# Patient Record
Sex: Female | Born: 1970 | Race: White | Hispanic: No | Marital: Single | Smoking: Current every day smoker
Health system: Southern US, Community
[De-identification: ages and names within clinical notes are randomized; demographics above are authoritative.]

## PROBLEM LIST (undated history)

## (undated) DIAGNOSIS — R059 Cough, unspecified: Secondary | ICD-10-CM

## (undated) DIAGNOSIS — F329 Major depressive disorder, single episode, unspecified: Secondary | ICD-10-CM

## (undated) DIAGNOSIS — J45909 Unspecified asthma, uncomplicated: Secondary | ICD-10-CM

## (undated) DIAGNOSIS — R51 Headache: Secondary | ICD-10-CM

## (undated) DIAGNOSIS — F419 Anxiety disorder, unspecified: Secondary | ICD-10-CM

## (undated) DIAGNOSIS — R05 Cough: Secondary | ICD-10-CM

## (undated) DIAGNOSIS — F32A Depression, unspecified: Secondary | ICD-10-CM

## (undated) DIAGNOSIS — K219 Gastro-esophageal reflux disease without esophagitis: Secondary | ICD-10-CM

## (undated) DIAGNOSIS — R519 Headache, unspecified: Secondary | ICD-10-CM

## (undated) HISTORY — PX: TUBAL LIGATION: SHX77

---

## 2000-10-31 ENCOUNTER — Other Ambulatory Visit: Admission: RE | Admit: 2000-10-31 | Discharge: 2000-10-31 | Payer: Self-pay | Admitting: Obstetrics and Gynecology

## 2001-01-01 ENCOUNTER — Other Ambulatory Visit: Admission: RE | Admit: 2001-01-01 | Discharge: 2001-01-01 | Payer: Self-pay | Admitting: Obstetrics and Gynecology

## 2001-01-03 ENCOUNTER — Inpatient Hospital Stay (HOSPITAL_COMMUNITY): Admission: AD | Admit: 2001-01-03 | Discharge: 2001-01-03 | Payer: Self-pay | Admitting: Obstetrics and Gynecology

## 2001-03-24 ENCOUNTER — Inpatient Hospital Stay (HOSPITAL_COMMUNITY): Admission: AD | Admit: 2001-03-24 | Discharge: 2001-03-24 | Payer: Self-pay | Admitting: Obstetrics and Gynecology

## 2001-03-28 ENCOUNTER — Inpatient Hospital Stay (HOSPITAL_COMMUNITY): Admission: AD | Admit: 2001-03-28 | Discharge: 2001-03-28 | Payer: Self-pay | Admitting: Obstetrics and Gynecology

## 2001-04-04 ENCOUNTER — Inpatient Hospital Stay (HOSPITAL_COMMUNITY): Admission: AD | Admit: 2001-04-04 | Discharge: 2001-04-06 | Payer: Self-pay | Admitting: Obstetrics and Gynecology

## 2003-01-14 ENCOUNTER — Emergency Department (HOSPITAL_COMMUNITY): Admission: EM | Admit: 2003-01-14 | Discharge: 2003-01-14 | Payer: Self-pay | Admitting: Emergency Medicine

## 2003-06-16 ENCOUNTER — Emergency Department (HOSPITAL_COMMUNITY): Admission: EM | Admit: 2003-06-16 | Discharge: 2003-06-16 | Payer: Self-pay | Admitting: Emergency Medicine

## 2004-02-09 ENCOUNTER — Emergency Department (HOSPITAL_COMMUNITY): Admission: EM | Admit: 2004-02-09 | Discharge: 2004-02-09 | Payer: Self-pay | Admitting: Emergency Medicine

## 2004-12-13 ENCOUNTER — Encounter: Payer: Self-pay | Admitting: Emergency Medicine

## 2004-12-13 ENCOUNTER — Inpatient Hospital Stay (HOSPITAL_COMMUNITY): Admission: RE | Admit: 2004-12-13 | Discharge: 2004-12-19 | Payer: Self-pay | Admitting: Psychiatry

## 2004-12-13 ENCOUNTER — Ambulatory Visit: Payer: Self-pay | Admitting: Psychiatry

## 2012-08-13 ENCOUNTER — Emergency Department (HOSPITAL_COMMUNITY)
Admission: EM | Admit: 2012-08-13 | Discharge: 2012-08-14 | Disposition: A | Discharge status: Home / Self Care | Payer: Self-pay | Attending: Emergency Medicine | Admitting: Emergency Medicine

## 2012-08-13 ENCOUNTER — Encounter (HOSPITAL_COMMUNITY): Payer: Self-pay | Admitting: *Deleted

## 2012-08-13 DIAGNOSIS — Z79899 Other long term (current) drug therapy: Secondary | ICD-10-CM | POA: Insufficient documentation

## 2012-08-13 DIAGNOSIS — K219 Gastro-esophageal reflux disease without esophagitis: Secondary | ICD-10-CM | POA: Insufficient documentation

## 2012-08-13 DIAGNOSIS — R1013 Epigastric pain: Secondary | ICD-10-CM | POA: Insufficient documentation

## 2012-08-13 DIAGNOSIS — F17200 Nicotine dependence, unspecified, uncomplicated: Secondary | ICD-10-CM | POA: Insufficient documentation

## 2012-08-13 DIAGNOSIS — Z3202 Encounter for pregnancy test, result negative: Secondary | ICD-10-CM | POA: Insufficient documentation

## 2012-08-13 DIAGNOSIS — R112 Nausea with vomiting, unspecified: Secondary | ICD-10-CM | POA: Insufficient documentation

## 2012-08-13 HISTORY — DX: Gastro-esophageal reflux disease without esophagitis: K21.9

## 2012-08-13 LAB — CBC WITH DIFFERENTIAL/PLATELET
Basophils Relative: 0 % (ref 0–1)
Eosinophils Relative: 3 % (ref 0–5)
HCT: 40.7 % (ref 36.0–46.0)
Hemoglobin: 13.7 g/dL (ref 12.0–15.0)
Lymphocytes Relative: 11 % — ABNORMAL LOW (ref 12–46)
MCHC: 33.7 g/dL (ref 30.0–36.0)
MCV: 89.8 fL (ref 78.0–100.0)
Monocytes Absolute: 0.5 10*3/uL (ref 0.1–1.0)
Monocytes Relative: 4 % (ref 3–12)
Neutro Abs: 8.6 10*3/uL — ABNORMAL HIGH (ref 1.7–7.7)
RDW: 12.9 % (ref 11.5–15.5)

## 2012-08-13 LAB — URINALYSIS, ROUTINE W REFLEX MICROSCOPIC
Nitrite: NEGATIVE
Protein, ur: NEGATIVE mg/dL
Specific Gravity, Urine: 1.028 (ref 1.005–1.030)
Urobilinogen, UA: 0.2 mg/dL (ref 0.0–1.0)

## 2012-08-13 LAB — COMPREHENSIVE METABOLIC PANEL
BUN: 12 mg/dL (ref 6–23)
CO2: 24 mEq/L (ref 19–32)
Calcium: 9 mg/dL (ref 8.4–10.5)
Chloride: 104 mEq/L (ref 96–112)
Creatinine, Ser: 0.61 mg/dL (ref 0.50–1.10)
GFR calc Af Amer: >90 mL/min (ref >90)
GFR calc non Af Amer: >90 mL/min (ref >90)
Total Bilirubin: 0.4 mg/dL (ref 0.3–1.2)

## 2012-08-13 LAB — PREGNANCY, URINE: Preg Test, Ur: NEGATIVE

## 2012-08-13 LAB — URINE MICROSCOPIC-ADD ON

## 2012-08-13 MED ORDER — ONDANSETRON HCL 4 MG PO TABS: 4.0000 mg | ORAL_TABLET | Freq: Four times a day (QID) | ORAL | Status: DC

## 2012-08-13 MED ORDER — ONDANSETRON HCL 4 MG/2ML IJ SOLN
4.0000 mg | Freq: Once | INTRAMUSCULAR | Status: AC
  Administered 2012-08-13: 4 mg via INTRAVENOUS
  Filled 2012-08-13: qty 2

## 2012-08-13 MED ORDER — FAMOTIDINE 20 MG PO TABS: 20.0000 mg | ORAL_TABLET | Freq: Two times a day (BID) | ORAL | Status: DC

## 2012-08-13 MED ORDER — SODIUM CHLORIDE 0.9 % IV BOLUS (SEPSIS)
1000.0000 mL | Freq: Once | INTRAVENOUS | Status: AC
  Administered 2012-08-13: 1000 mL via INTRAVENOUS

## 2012-08-13 MED ORDER — FAMOTIDINE IN NACL 20-0.9 MG/50ML-% IV SOLN
20.0000 mg | Freq: Once | INTRAVENOUS | Status: AC
  Administered 2012-08-13: 20 mg via INTRAVENOUS
  Filled 2012-08-13: qty 50

## 2012-08-13 NOTE — ED Notes (Signed)
Pt started vomiting around 3pm; c/o bright red blood in emesis; small amt blood; denies black or bloody stools; abd pain

## 2012-08-13 NOTE — ED Provider Notes (Signed)
History     CSN: [id number]  Arrival date & time 08/13/12  4782   First MD Initiated Contact with Patient 08/13/12 2006      Chief Complaint  Patient presents with  . Hematemesis    (Consider location/radiation/quality/duration/timing/severity/associated sxs/prior treatment) HPI Comments: Patient has had multiple episodes of vomiting since 3PM today she has noticed streaks of blood in emesis.  Has a Hx of GERD and duodenal ulcer Dx 1 years ago but has not taken any medications as her insurance lapsed. Her daughter has been ill with  Same for the past  3 days   The history is provided by the patient.    Past Medical History  Diagnosis Date  . GERD (gastroesophageal reflux disease)     History reviewed. No pertinent past surgical history.  No family history on file.  History  Substance Use Topics  . Smoking status: Current Every Day Smoker -- 0.25 packs/day    Types: Cigarettes  . Smokeless tobacco: Not on file  . Alcohol Use: Yes     Comment: social    OB History   Grav Para Term Preterm Abortions TAB SAB Ect Mult Living                  Review of Systems  Constitutional: Negative for fever and chills.  Gastrointestinal: Positive for nausea, vomiting and abdominal pain. Negative for diarrhea and constipation.  Musculoskeletal: Negative for myalgias and back pain.  Skin: Negative for pallor.  All other systems reviewed and are negative.    Allergies  Review of patient's allergies indicates no known allergies.  Home Medications   Current Outpatient Rx  Name  Route  Sig  Dispense  Refill  . ibuprofen (ADVIL,MOTRIN) 200 MG tablet   Oral   Take 400 mg by mouth every 8 (eight) hours as needed for pain.         . Multiple Vitamin (MULTIVITAMIN WITH MINERALS) TABS   Oral   Take 1 tablet by mouth daily.         . famotidine (PEPCID) 20 MG tablet   Oral   Take 1 tablet (20 mg total) by mouth 2 (two) times daily.   30 tablet   0     Take twice a day  for 2 weeks than daily   . ondansetron (ZOFRAN) 4 MG tablet   Oral   Take 1 tablet (4 mg total) by mouth every 6 (six) hours.   12 tablet   0     BP 93/48  Pulse 74  Temp(Src) 98.9 F (37.2 C) (Oral)  Resp 18  SpO2 100%  LMP 08/06/2012  Physical Exam  Constitutional: She is oriented to person, place, and time. She appears well-developed and well-nourished.  HENT:  Head: Normocephalic.  Eyes: Pupils are equal, round, and reactive to light.  Cardiovascular: Normal rate and regular rhythm.   Pulmonary/Chest: Effort normal and breath sounds normal.  Abdominal: Soft. Bowel sounds are normal. She exhibits no distension. There is no hepatosplenomegaly. There is tenderness in the epigastric area. There is no rebound, no guarding and negative Murphy's sign.  Musculoskeletal: Normal range of motion.  Neurological: She is alert and oriented to person, place, and time.  Skin: Skin is warm.    ED Course  Procedures (including critical care time)  Labs Reviewed  CBC WITH DIFFERENTIAL - Abnormal; Notable for the following:    WBC 10.6 (*)    Neutrophils Relative 81 (*)    Neutro Abs  8.6 (*)    Lymphocytes Relative 11 (*)    All other components within normal limits  COMPREHENSIVE METABOLIC PANEL - Abnormal; Notable for the following:    Glucose, Bld 101 (*)    All other components within normal limits  URINALYSIS, ROUTINE W REFLEX MICROSCOPIC - Abnormal; Notable for the following:    APPearance CLOUDY (*)    Hgb urine dipstick LARGE (*)    Leukocytes, UA TRACE (*)    All other components within normal limits  URINE MICROSCOPIC-ADD ON - Abnormal; Notable for the following:    Squamous Epithelial / LPF MANY (*)    All other components within normal limits  PREGNANCY, URINE   No results found.   1. Nausea & vomiting   2. Epigastric pain       MDM  Will hydrate, give antiemetic  labs normal  Tolerating PO will DC home with Zofran, and RX for Pepcid with Gi referral         Arman Filter, NP 08/13/12 2348

## 2012-08-13 NOTE — ED Provider Notes (Signed)
Medical screening examination/treatment/procedure(s) were performed by non-physician practitioner and as supervising physician I was immediately available for consultation/collaboration.    Celene Kras, MD 08/13/12 [phone]

## 2012-08-14 MED ORDER — FAMOTIDINE 20 MG PO TABS: 20.0000 mg | ORAL_TABLET | Freq: Two times a day (BID) | ORAL | Status: DC

## 2012-08-14 MED ORDER — ONDANSETRON HCL 4 MG PO TABS: 4.0000 mg | ORAL_TABLET | Freq: Four times a day (QID) | ORAL | Status: DC

## 2012-10-06 ENCOUNTER — Encounter (HOSPITAL_BASED_OUTPATIENT_CLINIC_OR_DEPARTMENT_OTHER): Payer: Self-pay | Admitting: Family Medicine

## 2012-10-06 ENCOUNTER — Emergency Department (HOSPITAL_BASED_OUTPATIENT_CLINIC_OR_DEPARTMENT_OTHER)
Admission: EM | Admit: 2012-10-06 | Discharge: 2012-10-06 | Disposition: A | Discharge status: Home / Self Care | Payer: Self-pay | Attending: Emergency Medicine | Admitting: Emergency Medicine

## 2012-10-06 DIAGNOSIS — X503XXA Overexertion from repetitive movements, initial encounter: Secondary | ICD-10-CM | POA: Insufficient documentation

## 2012-10-06 DIAGNOSIS — S39012A Strain of muscle, fascia and tendon of lower back, initial encounter: Secondary | ICD-10-CM

## 2012-10-06 DIAGNOSIS — K219 Gastro-esophageal reflux disease without esophagitis: Secondary | ICD-10-CM | POA: Insufficient documentation

## 2012-10-06 DIAGNOSIS — Y9389 Activity, other specified: Secondary | ICD-10-CM | POA: Insufficient documentation

## 2012-10-06 DIAGNOSIS — S335XXA Sprain of ligaments of lumbar spine, initial encounter: Secondary | ICD-10-CM | POA: Insufficient documentation

## 2012-10-06 DIAGNOSIS — Y9289 Other specified places as the place of occurrence of the external cause: Secondary | ICD-10-CM | POA: Insufficient documentation

## 2012-10-06 DIAGNOSIS — Y990 Civilian activity done for income or pay: Secondary | ICD-10-CM | POA: Insufficient documentation

## 2012-10-06 DIAGNOSIS — F17200 Nicotine dependence, unspecified, uncomplicated: Secondary | ICD-10-CM | POA: Insufficient documentation

## 2012-10-06 DIAGNOSIS — Z79899 Other long term (current) drug therapy: Secondary | ICD-10-CM | POA: Insufficient documentation

## 2012-10-06 MED ORDER — NAPROXEN 500 MG PO TABS: 500.0000 mg | ORAL_TABLET | Freq: Two times a day (BID) | ORAL | Status: DC

## 2012-10-06 MED ORDER — ONDANSETRON 4 MG PO TBDP
4.0000 mg | ORAL_TABLET | Freq: Once | ORAL | Status: AC
Start: 2012-10-06 — End: 2012-10-06
  Administered 2012-10-06: 4 mg via ORAL
  Filled 2012-10-06: qty 1

## 2012-10-06 MED ORDER — HYDROCODONE-ACETAMINOPHEN 5-325 MG PO TABS: 1.0000 | ORAL_TABLET | Freq: Four times a day (QID) | ORAL | Status: DC | PRN

## 2012-10-06 MED ORDER — HYDROMORPHONE HCL PF 2 MG/ML IJ SOLN
2.0000 mg | Freq: Once | INTRAMUSCULAR | Status: AC
  Administered 2012-10-06: 2 mg via INTRAMUSCULAR
  Filled 2012-10-06: qty 1

## 2012-10-06 MED ORDER — CYCLOBENZAPRINE HCL 10 MG PO TABS: 10.0000 mg | ORAL_TABLET | Freq: Two times a day (BID) | ORAL | Status: DC | PRN

## 2012-10-06 NOTE — ED Provider Notes (Signed)
History     CSN: [id number]  Arrival date & time 10/06/12  1111   First MD Initiated Contact with Patient 10/06/12 1129      Chief Complaint  Patient presents with  . Back Pain    (Consider location/radiation/quality/duration/timing/severity/associated sxs/prior treatment) Patient is a 42 y.o. female presenting with back pain. The history is provided by the patient and a friend.  Back Pain Associated symptoms: no abdominal pain, no chest pain, no dysuria, no fever and no headaches    patient with onset of right lower back pain radiating to the buttocks it started 2 days ago. 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 Patient without history of any back problems in the past. No direct injury or blow to the back. Patient denies any numbness or weakness to her right leg. Pain is 10 out of 10 radiates to the buttocks area described as sharp. Made worse by any kind of movement.  Past Medical History  Diagnosis Date  . GERD (gastroesophageal reflux disease)     Past Surgical History  Procedure Laterality Date  . Tubal ligation      No family history on file.  History  Substance Use Topics  . Smoking status: Current Every Day Smoker -- 0.25 packs/day    Types: Cigarettes  . Smokeless tobacco: Not on file  . Alcohol Use: Yes     Comment: social    OB History   Grav Para Term Preterm Abortions TAB SAB Ect Mult Living                  Review of Systems  Constitutional: Negative for fever.  HENT: Negative for neck pain.   Respiratory: Negative for shortness of breath.   Cardiovascular: Negative for chest pain and leg swelling.  Gastrointestinal: Negative for nausea, vomiting and abdominal pain.  Genitourinary: Negative for dysuria.  Musculoskeletal: Positive for back pain.  Skin: Negative for rash.  Neurological: Negative for headaches.  Hematological: Does not bruise/bleed easily.  Psychiatric/Behavioral: Negative for confusion.    Allergies   Review of patient's allergies indicates no known allergies.  Home Medications   Current Outpatient Rx  Name  Route  Sig  Dispense  Refill  . cyclobenzaprine (FLEXERIL) 10 MG tablet   Oral   Take 1 tablet (10 mg total) by mouth 2 (two) times daily as needed for muscle spasms.   20 tablet   0   . famotidine (PEPCID) 20 MG tablet   Oral   Take 1 tablet (20 mg total) by mouth 2 (two) times daily.   30 tablet   0     Take twice a day for 2 weeks than daily   . HYDROcodone-acetaminophen (NORCO/VICODIN) 5-325 MG per tablet   Oral   Take 1-2 tablets by mouth every 6 (six) hours as needed for pain.   20 tablet   0   . ibuprofen (ADVIL,MOTRIN) 200 MG tablet   Oral   Take 400 mg by mouth every 8 (eight) hours as needed for pain.         . Multiple Vitamin (MULTIVITAMIN WITH MINERALS) TABS   Oral   Take 1 tablet by mouth daily.         . naproxen (NAPROSYN) 500 MG tablet   Oral   Take 1 tablet (500 mg total) by mouth 2 (two) times daily.   14 tablet   0   . ondansetron (ZOFRAN) 4 MG tablet   Oral  Take 1 tablet (4 mg total) by mouth every 6 (six) hours.   12 tablet   0     BP 109/56  Pulse 70  Resp 16  Ht 5\' 3"  (1.6 m)  Wt 117 lb (53.071 kg)  BMI 20.73 kg/m2  SpO2 98%  LMP 09/15/2012  Physical Exam  Nursing note and vitals reviewed. Constitutional: She is oriented to person, place, and time. She appears well-developed and well-nourished.  HENT:  Head: Normocephalic and atraumatic.  Eyes: Conjunctivae are normal. Pupils are equal, round, and reactive to light.  Neck: Normal range of motion. Neck supple.  Cardiovascular: Normal rate, regular rhythm and normal heart sounds.   No murmur heard. Pulmonary/Chest: Effort normal and breath sounds normal.  Abdominal: Soft. Bowel sounds are normal. There is no tenderness.  Musculoskeletal: Normal range of motion. She exhibits no edema and no tenderness.  Neurological: She is alert and oriented to person, place,  and time. No cranial nerve deficit. She exhibits normal muscle tone. Coordination normal.  Skin: Skin is warm. No rash noted.    ED Course  Procedures (including critical care time)  Labs Reviewed - No data to display No results found.   1. Lumbar strain, initial encounter       MDM  The patient with acute lumbar strain no direct injury. Started on Saturday 2 days ago. Patient works and does a lot of heavy lifting at work. Things got worse yesterday much worse today. To the point where is very difficult for her to get out of bed. Left low back radiates into the left buttocks area of no neuro deficits to the left leg. Patient was taking ibuprofen at home. No history of back problems in the past. Exam here not consistent with any significant neuro deficits the lower trimming these her sciatica. No direct injury x-rays not required at this point in time.       Shelda Jakes, MD 10/06/12 [phone]

## 2012-10-06 NOTE — ED Notes (Signed)
Pt c/o low back pain x 2 days without known injury. Pt denies dysuria. Pt sts ibuprofen improves pain.

## 2013-01-09 ENCOUNTER — Encounter (HOSPITAL_BASED_OUTPATIENT_CLINIC_OR_DEPARTMENT_OTHER): Payer: Self-pay | Admitting: *Deleted

## 2013-01-09 ENCOUNTER — Emergency Department (HOSPITAL_BASED_OUTPATIENT_CLINIC_OR_DEPARTMENT_OTHER)
Admission: EM | Admit: 2013-01-09 | Discharge: 2013-01-09 | Disposition: A | Discharge status: Home / Self Care | Payer: Self-pay | Attending: Emergency Medicine | Admitting: Emergency Medicine

## 2013-01-09 DIAGNOSIS — Z791 Long term (current) use of non-steroidal anti-inflammatories (NSAID): Secondary | ICD-10-CM | POA: Insufficient documentation

## 2013-01-09 DIAGNOSIS — Z79899 Other long term (current) drug therapy: Secondary | ICD-10-CM | POA: Insufficient documentation

## 2013-01-09 DIAGNOSIS — F17200 Nicotine dependence, unspecified, uncomplicated: Secondary | ICD-10-CM | POA: Insufficient documentation

## 2013-01-09 DIAGNOSIS — R519 Headache, unspecified: Secondary | ICD-10-CM

## 2013-01-09 DIAGNOSIS — R51 Headache: Secondary | ICD-10-CM | POA: Insufficient documentation

## 2013-01-09 DIAGNOSIS — K219 Gastro-esophageal reflux disease without esophagitis: Secondary | ICD-10-CM | POA: Insufficient documentation

## 2013-01-09 MED ORDER — DIPHENHYDRAMINE HCL 50 MG/ML IJ SOLN
25.0000 mg | Freq: Once | INTRAMUSCULAR | Status: AC
  Administered 2013-01-09: 25 mg via INTRAVENOUS
  Filled 2013-01-09: qty 1

## 2013-01-09 MED ORDER — SODIUM CHLORIDE 0.9 % IV SOLN
Freq: Once | INTRAVENOUS | Status: AC
  Administered 2013-01-09: 1000 mL via INTRAVENOUS

## 2013-01-09 MED ORDER — METOCLOPRAMIDE HCL 5 MG/ML IJ SOLN
10.0000 mg | Freq: Once | INTRAMUSCULAR | Status: AC
  Administered 2013-01-09: 10 mg via INTRAVENOUS
  Filled 2013-01-09: qty 2

## 2013-01-09 MED ORDER — IBUPROFEN 800 MG PO TABS: 800.0000 mg | ORAL_TABLET | Freq: Three times a day (TID) | ORAL | Status: DC

## 2013-01-09 MED ORDER — KETOROLAC TROMETHAMINE 30 MG/ML IJ SOLN
30.0000 mg | Freq: Once | INTRAMUSCULAR | Status: AC
  Administered 2013-01-09: 30 mg via INTRAVENOUS
  Filled 2013-01-09: qty 1

## 2013-01-09 MED ORDER — HYDROCODONE-ACETAMINOPHEN 5-325 MG PO TABS: 2.0000 | ORAL_TABLET | ORAL | Status: DC | PRN

## 2013-01-09 NOTE — ED Notes (Signed)
Additional warm blankets given  

## 2013-01-09 NOTE — ED Provider Notes (Signed)
CSN: [id number]     Arrival date & time 01/09/13  2034 History     First MD Initiated Contact with Patient 01/09/13 2131     Chief Complaint  Patient presents with  . Migraine   (Consider location/radiation/quality/duration/timing/severity/associated sxs/prior Treatment) Patient is a 42 y.o. female presenting with migraines. The history is provided by the patient. No language interpreter was used.  Migraine This is a new problem. The current episode started today. The problem has been gradually worsening. Associated symptoms include headaches. Nothing aggravates the symptoms. She has tried nothing for the symptoms. The treatment provided moderate relief.  Pt complains of a headache for 3 days.  Pt reports pain to the back of her head.    Past Medical History  Diagnosis Date  . GERD (gastroesophageal reflux disease)    Past Surgical History  Procedure Laterality Date  . Tubal ligation     No family history on file. History  Substance Use Topics  . Smoking status: Current Every Day Smoker -- 0.25 packs/day    Types: Cigarettes  . Smokeless tobacco: Not on file  . Alcohol Use: Yes     Comment: social   OB History   Grav Para Term Preterm Abortions TAB SAB Ect Mult Living                 Review of Systems  Neurological: Positive for headaches.  All other systems reviewed and are negative.    Allergies  Review of patient's allergies indicates no known allergies.  Home Medications   Current Outpatient Rx  Name  Route  Sig  Dispense  Refill  . cyclobenzaprine (FLEXERIL) 10 MG tablet   Oral   Take 1 tablet (10 mg total) by mouth 2 (two) times daily as needed for muscle spasms.   20 tablet   0   . famotidine (PEPCID) 20 MG tablet   Oral   Take 1 tablet (20 mg total) by mouth 2 (two) times daily.   30 tablet   0     Take twice a day for 2 weeks than daily   . HYDROcodone-acetaminophen (NORCO/VICODIN) 5-325 MG per tablet   Oral   Take 1-2 tablets by mouth every  6 (six) hours as needed for pain.   20 tablet   0   . ibuprofen (ADVIL,MOTRIN) 200 MG tablet   Oral   Take 400 mg by mouth every 8 (eight) hours as needed for pain.         . Multiple Vitamin (MULTIVITAMIN WITH MINERALS) TABS   Oral   Take 1 tablet by mouth daily.         . naproxen (NAPROSYN) 500 MG tablet   Oral   Take 1 tablet (500 mg total) by mouth 2 (two) times daily.   14 tablet   0   . ondansetron (ZOFRAN) 4 MG tablet   Oral   Take 1 tablet (4 mg total) by mouth every 6 (six) hours.   12 tablet   0    BP 108/72  Pulse 73  Temp(Src) 98 F (36.7 C) (Oral)  Resp 16  Ht 5\' 3"  (1.6 m)  Wt 117 lb (53.071 kg)  BMI 20.73 kg/m2  SpO2 99% Physical Exam  Nursing note and vitals reviewed. Constitutional: She appears well-developed and well-nourished.  HENT:  Head: Normocephalic.  Eyes: Conjunctivae and EOM are normal. Pupils are equal, round, and reactive to light.  Neck: Normal range of motion. Neck supple.  Pulmonary/Chest: Effort normal.  Abdominal: Soft.  Musculoskeletal: Normal range of motion.  Neurological: She is alert.  Skin: Skin is warm.  Psychiatric: She has a normal mood and affect.    ED Course   Procedures (including critical care time)  Labs Reviewed - No data to display No results found. 1. Headache     MDM  Pt given Iv fluids, and [place] cocktail.    Pt advised rest.   Pt given rx for ibuprofen and hydrocodone  Elson Areas, PA-C 01/09/13 2255

## 2013-01-09 NOTE — ED Notes (Signed)
Pt reports pain at this time minimal compared to previous.  Friends stepping outside briefly while waiting d/c papers.  Warm blankets offered to pt.

## 2013-01-09 NOTE — ED Notes (Signed)
PA at bedside.

## 2013-01-09 NOTE — ED Provider Notes (Signed)
Medical screening examination/treatment/procedure(s) were performed by non-physician practitioner and as supervising physician I was immediately available for consultation/collaboration.   Gavin Pound. Oletta Lamas, MD 01/09/13 2258

## 2013-01-09 NOTE — ED Notes (Signed)
Headache x 3 days. Nausea.  

## 2013-03-05 ENCOUNTER — Ambulatory Visit (HOSPITAL_COMMUNITY)
Admission: RE | Admit: 2013-03-05 | Discharge: 2013-03-05 | Disposition: A | Discharge status: Home / Self Care | Payer: Self-pay | Source: Ambulatory Visit | Attending: Chiropractic Medicine | Admitting: Chiropractic Medicine

## 2013-03-05 ENCOUNTER — Ambulatory Visit (HOSPITAL_COMMUNITY)
Admission: RE | Admit: 2013-03-05 | Discharge: 2013-03-05 | Disposition: A | Discharge status: Home / Self Care | Payer: BC Managed Care – PPO | Source: Ambulatory Visit | Attending: Chiropractic Medicine | Admitting: Chiropractic Medicine

## 2013-03-05 ENCOUNTER — Other Ambulatory Visit (HOSPITAL_COMMUNITY): Payer: Self-pay | Admitting: Chiropractic Medicine

## 2013-03-05 DIAGNOSIS — M549 Dorsalgia, unspecified: Secondary | ICD-10-CM | POA: Insufficient documentation

## 2013-03-05 DIAGNOSIS — M25511 Pain in right shoulder: Secondary | ICD-10-CM

## 2013-03-05 DIAGNOSIS — M25519 Pain in unspecified shoulder: Secondary | ICD-10-CM | POA: Insufficient documentation

## 2013-03-05 DIAGNOSIS — M542 Cervicalgia: Secondary | ICD-10-CM

## 2013-03-05 DIAGNOSIS — M5030 Other cervical disc degeneration, unspecified cervical region: Secondary | ICD-10-CM | POA: Insufficient documentation

## 2013-10-13 ENCOUNTER — Other Ambulatory Visit: Payer: Self-pay | Admitting: Neurosurgery

## 2013-10-13 DIAGNOSIS — M5412 Radiculopathy, cervical region: Secondary | ICD-10-CM

## 2013-10-26 ENCOUNTER — Ambulatory Visit
Admission: RE | Admit: 2013-10-26 | Discharge: 2013-10-26 | Disposition: A | Discharge status: Home / Self Care | Payer: BC Managed Care – PPO | Source: Ambulatory Visit | Attending: Neurosurgery | Admitting: Neurosurgery

## 2013-10-26 VITALS — BP 84/48 | HR 52

## 2013-10-26 DIAGNOSIS — M5412 Radiculopathy, cervical region: Secondary | ICD-10-CM

## 2013-10-26 MED ORDER — DIAZEPAM 5 MG PO TABS
5.0000 mg | ORAL_TABLET | Freq: Once | ORAL | Status: AC
  Administered 2013-10-26: 5 mg via ORAL

## 2013-10-26 MED ORDER — IOHEXOL 300 MG/ML  SOLN
10.0000 mL | Freq: Once | INTRAMUSCULAR | Status: AC | PRN
  Administered 2013-10-26: 10 mL via INTRATHECAL

## 2013-10-26 MED ORDER — MEPERIDINE HCL 100 MG/ML IJ SOLN
75.0000 mg | Freq: Once | INTRAMUSCULAR | Status: AC
  Administered 2013-10-26: 75 mg via INTRAMUSCULAR

## 2013-10-26 MED ORDER — ONDANSETRON HCL 8 MG PO TABS
8.0000 mg | ORAL_TABLET | Freq: Once | ORAL | Status: AC
  Administered 2013-10-26: 8 mg via ORAL

## 2013-10-26 NOTE — Progress Notes (Signed)
Patient states she has been off Wellbutrin for the past two days.

## 2013-10-26 NOTE — Discharge Instructions (Signed)
Myelogram Discharge Instructions  1. Go home and rest quietly for the next 24 hours.  It is important to lie flat for the next 24 hours.  Get up only to go to the restroom.  You may lie in the bed or on a couch on your back, your stomach, your left side or your right side.  You may have one pillow under your head.  You may have pillows between your knees while you are on your side or under your knees while you are on your back.  2. DO NOT drive today.  Recline the seat as far back as it will go, while still wearing your seat belt, on the way home.  3. You may get up to go to the bathroom as needed.  You may sit up for 10 minutes to eat.  You may resume your normal diet and medications unless otherwise indicated.  Drink lots of extra fluids today and tomorrow.  4. The incidence of headache, nausea, or vomiting is about 5% (one in 20 patients).  If you develop a headache, lie flat and drink plenty of fluids until the headache goes away.  Caffeinated beverages may be helpful.  If you develop severe nausea and vomiting or a headache that does not go away with flat bed rest, call [phone].  5. You may resume normal activities after your 24 hours of bed rest is over; however, do not exert yourself strongly or do any heavy lifting tomorrow. If when you get up you have a headache when standing, go back to bed and force fluids for another 24 hours.  6. Call your physician for a follow-up appointment.  The results of your myelogram will be sent directly to your physician by the following day.  7. If you have any questions or if complications develop after you arrive home, please call [phone].  Discharge instructions have been explained to the patient.  The patient, or the person responsible for the patient, fully understands these instructions.      May resume Wellbutrin on Oct 27, 2013, after 1:00 pm.

## 2014-03-08 ENCOUNTER — Other Ambulatory Visit: Payer: Self-pay

## 2014-03-08 DIAGNOSIS — Z1231 Encounter for screening mammogram for malignant neoplasm of breast: Secondary | ICD-10-CM

## 2014-03-15 ENCOUNTER — Ambulatory Visit
Admission: RE | Admit: 2014-03-15 | Discharge: 2014-03-15 | Disposition: A | Discharge status: Home / Self Care | Payer: BC Managed Care – PPO | Source: Ambulatory Visit

## 2014-03-15 DIAGNOSIS — Z1231 Encounter for screening mammogram for malignant neoplasm of breast: Secondary | ICD-10-CM

## 2014-04-14 ENCOUNTER — Other Ambulatory Visit: Payer: Self-pay | Admitting: Neurosurgery

## 2014-04-21 ENCOUNTER — Encounter (HOSPITAL_COMMUNITY)
Admission: RE | Admit: 2014-04-21 | Discharge: 2014-04-21 | Disposition: A | Discharge status: Home / Self Care | Payer: BC Managed Care – PPO | Source: Ambulatory Visit | Attending: Neurosurgery | Admitting: Neurosurgery

## 2014-04-21 ENCOUNTER — Encounter (HOSPITAL_COMMUNITY): Payer: Self-pay

## 2014-04-21 DIAGNOSIS — R0602 Shortness of breath: Secondary | ICD-10-CM

## 2014-04-21 HISTORY — DX: Cough: R05

## 2014-04-21 HISTORY — DX: Cough, unspecified: R05.9

## 2014-04-21 HISTORY — DX: Anxiety disorder, unspecified: F41.9

## 2014-04-21 HISTORY — DX: Major depressive disorder, single episode, unspecified: F32.9

## 2014-04-21 HISTORY — DX: Headache: R51

## 2014-04-21 HISTORY — DX: Headache, unspecified: R51.9

## 2014-04-21 HISTORY — DX: Unspecified asthma, uncomplicated: J45.909

## 2014-04-21 HISTORY — DX: Depression, unspecified: F32.A

## 2014-04-21 LAB — BASIC METABOLIC PANEL
Anion gap: 12 (ref 5–15)
BUN: 13 mg/dL (ref 6–23)
CALCIUM: 9.4 mg/dL (ref 8.4–10.5)
CO2: 24 mEq/L (ref 19–32)
Chloride: 104 mEq/L (ref 96–112)
Creatinine, Ser: 0.69 mg/dL (ref 0.50–1.10)
Glucose, Bld: 80 mg/dL (ref 70–99)
POTASSIUM: 3.9 meq/L (ref 3.7–5.3)
Sodium: 140 mEq/L (ref 137–147)

## 2014-04-21 LAB — CBC
HCT: 39.4 % (ref 36.0–46.0)
Hemoglobin: 13.1 g/dL (ref 12.0–15.0)
MCH: 30.6 pg (ref 26.0–34.0)
MCHC: 33.2 g/dL (ref 30.0–36.0)
MCV: 92.1 fL (ref 78.0–100.0)
PLATELETS: 221 10*3/uL (ref 150–400)
RBC: 4.28 MIL/uL (ref 3.87–5.11)
RDW: 13.3 % (ref 11.5–15.5)
WBC: 6.4 10*3/uL (ref 4.0–10.5)

## 2014-04-21 LAB — SURGICAL PCR SCREEN
MRSA, PCR: NEGATIVE
STAPHYLOCOCCUS AUREUS: NEGATIVE

## 2014-04-21 LAB — HCG, SERUM, QUALITATIVE: PREG SERUM: NEGATIVE

## 2014-04-21 NOTE — Pre-Procedure Instructions (Signed)
Kirsten Benson  04/21/2014   Your procedure is scheduled on:  04/23/14  Report to [facility] Admitting at 1145 AM.  Call this number if you have problems the morning of surgery: [phone]   Remember:   Do not eat food or drink liquids after midnight.   Take these medicines the morning of surgery with A SIP OF WATER: pepsid ydrocodone,flexeril   Do not wear jewelry, make-up or nail polish.  Do not wear lotions, powders, or perfumes. You may wear deodorant.  Do not shave 48 hours prior to surgery. Men may shave face and neck.  Do not bring valuables to the hospital.  [facility] is not responsible                  for any belongings or valuables.               Contacts, dentures or bridgework may not be worn into surgery.  Leave suitcase in the car. After surgery it may be brought to your room.  For patients admitted to the hospital, discharge time is determined by your                treatment team.               Patients discharged the day of surgery will not be allowed to drive  home.  Name and phone number of your driver: family  Special Instructions: Shower using CHG 2 nights before surgery and the night before surgery.  If you shower the day of surgery use CHG.  Use special wash - you have one bottle of CHG for all showers.  You should use approximately 1/3 of the bottle for each shower.   Please read over the following fact sheets that you were given: Pain Booklet, Coughing and Deep Breathing, MRSA Information and Surgical Site Infection Prevention

## 2014-04-22 MED ORDER — CEFAZOLIN SODIUM-DEXTROSE 2-3 GM-% IV SOLR
2.0000 g | INTRAVENOUS | Status: AC
  Administered 2014-04-23: 2 g via INTRAVENOUS
  Filled 2014-04-22: qty 50
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
## 2014-04-23 ENCOUNTER — Encounter (HOSPITAL_COMMUNITY): Payer: Self-pay | Admitting: *Deleted

## 2014-04-23 ENCOUNTER — Inpatient Hospital Stay (HOSPITAL_COMMUNITY): Payer: BC Managed Care – PPO | Admitting: Certified Registered Nurse Anesthetist

## 2014-04-23 ENCOUNTER — Inpatient Hospital Stay (HOSPITAL_COMMUNITY): Payer: BC Managed Care – PPO

## 2014-04-23 ENCOUNTER — Encounter (HOSPITAL_COMMUNITY)
Admission: RE | Disposition: A | Discharge status: Home / Self Care | Payer: Self-pay | Source: Ambulatory Visit | Attending: Neurosurgery

## 2014-04-23 ENCOUNTER — Inpatient Hospital Stay (HOSPITAL_COMMUNITY)
Admission: RE | Admit: 2014-04-23 | Discharge: 2014-04-25 | DRG: 473 | Disposition: A | Discharge status: Home / Self Care | Payer: BC Managed Care – PPO | Source: Ambulatory Visit | Attending: Neurosurgery | Admitting: Neurosurgery

## 2014-04-23 DIAGNOSIS — F329 Major depressive disorder, single episode, unspecified: Secondary | ICD-10-CM | POA: Diagnosis present

## 2014-04-23 DIAGNOSIS — Z01810 Encounter for preprocedural cardiovascular examination: Secondary | ICD-10-CM | POA: Diagnosis not present

## 2014-04-23 DIAGNOSIS — M79601 Pain in right arm: Secondary | ICD-10-CM | POA: Diagnosis present

## 2014-04-23 DIAGNOSIS — K219 Gastro-esophageal reflux disease without esophagitis: Secondary | ICD-10-CM | POA: Diagnosis present

## 2014-04-23 DIAGNOSIS — F17210 Nicotine dependence, cigarettes, uncomplicated: Secondary | ICD-10-CM | POA: Diagnosis present

## 2014-04-23 DIAGNOSIS — M4802 Spinal stenosis, cervical region: Secondary | ICD-10-CM | POA: Diagnosis present

## 2014-04-23 DIAGNOSIS — F419 Anxiety disorder, unspecified: Secondary | ICD-10-CM | POA: Diagnosis present

## 2014-04-23 DIAGNOSIS — Z01812 Encounter for preprocedural laboratory examination: Secondary | ICD-10-CM | POA: Diagnosis not present

## 2014-04-23 DIAGNOSIS — M5412 Radiculopathy, cervical region: Secondary | ICD-10-CM | POA: Diagnosis present

## 2014-04-23 HISTORY — PX: ANTERIOR CERVICAL DECOMPRESSION/DISCECTOMY FUSION 4 LEVELS: SHX5556

## 2014-04-23 SURGERY — ANTERIOR CERVICAL DECOMPRESSION/DISCECTOMY FUSION 4 LEVELS
Anesthesia: General

## 2014-04-23 MED ORDER — HYDROMORPHONE HCL 1 MG/ML IJ SOLN
0.2500 mg | INTRAMUSCULAR | Status: DC | PRN
  Administered 2014-04-23 (×2): 0.5 mg via INTRAVENOUS

## 2014-04-23 MED ORDER — MIDAZOLAM HCL 2 MG/2ML IJ SOLN
INTRAMUSCULAR | Status: AC
  Filled 2014-04-23: qty 2

## 2014-04-23 MED ORDER — LACTATED RINGERS IV SOLN
INTRAVENOUS | Status: DC
  Administered 2014-04-23 (×3): via INTRAVENOUS

## 2014-04-23 MED ORDER — HYDROMORPHONE HCL 1 MG/ML IJ SOLN
INTRAMUSCULAR | Status: AC
  Administered 2014-04-23: 0.5 mg via INTRAVENOUS
  Filled 2014-04-23: qty 1

## 2014-04-23 MED ORDER — ACETAMINOPHEN 325 MG PO TABS: 325.0000 mg | ORAL_TABLET | ORAL | Status: DC | PRN

## 2014-04-23 MED ORDER — THROMBIN 20000 UNITS EX SOLR
CUTANEOUS | Status: DC | PRN
  Administered 2014-04-23: 20 mL via TOPICAL

## 2014-04-23 MED ORDER — GLYCOPYRROLATE 0.2 MG/ML IJ SOLN
INTRAMUSCULAR | Status: AC
  Filled 2014-04-23: qty 3

## 2014-04-23 MED ORDER — FENTANYL CITRATE 0.05 MG/ML IJ SOLN
INTRAMUSCULAR | Status: AC
  Filled 2014-04-23: qty 5

## 2014-04-23 MED ORDER — DIAZEPAM 5 MG/ML IJ SOLN
INTRAMUSCULAR | Status: AC
  Administered 2014-04-23: 2.5 mg via INTRAVENOUS
  Filled 2014-04-23: qty 2

## 2014-04-23 MED ORDER — ZOLPIDEM TARTRATE 5 MG PO TABS: 5.0000 mg | ORAL_TABLET | Freq: Every evening | ORAL | Status: DC | PRN

## 2014-04-23 MED ORDER — ONDANSETRON HCL 4 MG/2ML IJ SOLN
INTRAMUSCULAR | Status: AC
  Filled 2014-04-23: qty 2

## 2014-04-23 MED ORDER — SODIUM CHLORIDE 0.9 % IV SOLN: 250.0000 mL | INTRAVENOUS | Status: DC

## 2014-04-23 MED ORDER — MENTHOL 3 MG MT LOZG
1.0000 | LOZENGE | OROMUCOSAL | Status: DC | PRN
  Administered 2014-04-23 – 2014-04-24 (×4): 3 mg via ORAL
  Filled 2014-04-23 (×3): qty 9

## 2014-04-23 MED ORDER — MENTHOL 3 MG MT LOZG: 1.0000 | LOZENGE | OROMUCOSAL | Status: DC | PRN

## 2014-04-23 MED ORDER — PHENYLEPHRINE HCL 10 MG/ML IJ SOLN
INTRAMUSCULAR | Status: DC | PRN
  Administered 2014-04-23: 40 ug via INTRAVENOUS

## 2014-04-23 MED ORDER — ARTIFICIAL TEARS OP OINT
TOPICAL_OINTMENT | OPHTHALMIC | Status: AC
  Filled 2014-04-23: qty 3.5

## 2014-04-23 MED ORDER — PHENOL 1.4 % MT LIQD
1.0000 | OROMUCOSAL | Status: DC | PRN
  Administered 2014-04-24: 1 via OROMUCOSAL
  Filled 2014-04-23: qty 177

## 2014-04-23 MED ORDER — OXYCODONE HCL 5 MG/5ML PO SOLN: 5.0000 mg | Freq: Once | ORAL | Status: DC | PRN

## 2014-04-23 MED ORDER — LIDOCAINE HCL (CARDIAC) 20 MG/ML IV SOLN
INTRAVENOUS | Status: AC
  Filled 2014-04-23: qty 10

## 2014-04-23 MED ORDER — DEXMEDETOMIDINE HCL IN NACL 200 MCG/50ML IV SOLN
INTRAVENOUS | Status: AC
  Filled 2014-04-23: qty 50

## 2014-04-23 MED ORDER — GELATIN ABSORBABLE MT POWD
OROMUCOSAL | Status: DC | PRN
  Administered 2014-04-23: 5 mL via TOPICAL
  Administered 2014-04-23: 18:00:00 via TOPICAL

## 2014-04-23 MED ORDER — ARTIFICIAL TEARS OP OINT
TOPICAL_OINTMENT | OPHTHALMIC | Status: DC | PRN
  Administered 2014-04-23: 1 via OPHTHALMIC

## 2014-04-23 MED ORDER — ACETAMINOPHEN 160 MG/5ML PO SOLN
325.0000 mg | ORAL | Status: DC | PRN
  Filled 2014-04-23: qty 20.3

## 2014-04-23 MED ORDER — OXYCODONE-ACETAMINOPHEN 5-325 MG PO TABS
1.0000 | ORAL_TABLET | ORAL | Status: DC | PRN
  Administered 2014-04-24 – 2014-04-25 (×5): 2 via ORAL
  Filled 2014-04-23 (×5): qty 2

## 2014-04-23 MED ORDER — ONDANSETRON HCL 4 MG/2ML IJ SOLN
4.0000 mg | INTRAMUSCULAR | Status: DC | PRN
Start: 2014-04-23 — End: 2014-04-25
  Administered 2014-04-23 – 2014-04-24 (×2): 4 mg via INTRAVENOUS
  Filled 2014-04-23 (×2): qty 2

## 2014-04-23 MED ORDER — OXYCODONE HCL 5 MG PO TABS
ORAL_TABLET | ORAL | Status: AC
  Administered 2014-04-23: 5 mg via ORAL
  Filled 2014-04-23: qty 1

## 2014-04-23 MED ORDER — ACETAMINOPHEN 325 MG PO TABS: 650.0000 mg | ORAL_TABLET | ORAL | Status: DC | PRN

## 2014-04-23 MED ORDER — DEXAMETHASONE 4 MG PO TABS
4.0000 mg | ORAL_TABLET | Freq: Four times a day (QID) | ORAL | Status: DC
  Administered 2014-04-24 – 2014-04-25 (×2): 4 mg via ORAL
  Filled 2014-04-23 (×3): qty 1

## 2014-04-23 MED ORDER — DEXAMETHASONE SODIUM PHOSPHATE 10 MG/ML IJ SOLN
INTRAMUSCULAR | Status: AC
  Filled 2014-04-23: qty 1

## 2014-04-23 MED ORDER — EPHEDRINE SULFATE 50 MG/ML IJ SOLN
INTRAMUSCULAR | Status: AC
  Filled 2014-04-23: qty 1

## 2014-04-23 MED ORDER — SODIUM CHLORIDE 0.9 % IV SOLN
INTRAVENOUS | Status: DC
  Administered 2014-04-23 – 2014-04-25 (×3): via INTRAVENOUS

## 2014-04-23 MED ORDER — ROCURONIUM BROMIDE 50 MG/5ML IV SOLN
INTRAVENOUS | Status: AC
  Filled 2014-04-23: qty 1

## 2014-04-23 MED ORDER — SODIUM CHLORIDE 0.9 % IJ SOLN: 3.0000 mL | INTRAMUSCULAR | Status: DC | PRN

## 2014-04-23 MED ORDER — ONDANSETRON HCL 4 MG/2ML IJ SOLN
INTRAMUSCULAR | Status: AC
  Administered 2014-04-23: 4 mg via INTRAVENOUS
  Filled 2014-04-23: qty 2

## 2014-04-23 MED ORDER — FENTANYL CITRATE 0.05 MG/ML IJ SOLN
INTRAMUSCULAR | Status: DC | PRN
  Administered 2014-04-23 (×6): 50 ug via INTRAVENOUS
  Administered 2014-04-23: 100 ug via INTRAVENOUS
  Administered 2014-04-23 (×2): 50 ug via INTRAVENOUS

## 2014-04-23 MED ORDER — DEXAMETHASONE SODIUM PHOSPHATE 4 MG/ML IJ SOLN
4.0000 mg | Freq: Four times a day (QID) | INTRAMUSCULAR | Status: DC
  Administered 2014-04-23 – 2014-04-24 (×4): 4 mg via INTRAVENOUS
  Filled 2014-04-23 (×5): qty 1

## 2014-04-23 MED ORDER — OXYCODONE HCL 5 MG PO TABS
5.0000 mg | ORAL_TABLET | Freq: Once | ORAL | Status: DC | PRN
  Administered 2014-04-23: 5 mg via ORAL

## 2014-04-23 MED ORDER — PROPOFOL 10 MG/ML IV BOLUS
INTRAVENOUS | Status: AC
  Filled 2014-04-23: qty 20

## 2014-04-23 MED ORDER — CEFAZOLIN SODIUM 1-5 GM-% IV SOLN
1.0000 g | Freq: Three times a day (TID) | INTRAVENOUS | Status: AC
  Administered 2014-04-23 – 2014-04-24 (×2): 1 g via INTRAVENOUS
  Filled 2014-04-23 (×2): qty 50

## 2014-04-23 MED ORDER — SODIUM CHLORIDE 0.9 % IJ SOLN
3.0000 mL | Freq: Two times a day (BID) | INTRAMUSCULAR | Status: DC
  Administered 2014-04-23: 3 mL via INTRAVENOUS

## 2014-04-23 MED ORDER — ONDANSETRON HCL 4 MG/2ML IJ SOLN
INTRAMUSCULAR | Status: DC | PRN
  Administered 2014-04-23: 4 mg via INTRAVENOUS

## 2014-04-23 MED ORDER — MIDAZOLAM HCL 5 MG/5ML IJ SOLN
INTRAMUSCULAR | Status: DC | PRN
  Administered 2014-04-23: 2 mg via INTRAVENOUS

## 2014-04-23 MED ORDER — NEOSTIGMINE METHYLSULFATE 10 MG/10ML IV SOLN
INTRAVENOUS | Status: DC | PRN
  Administered 2014-04-23: 4 mg via INTRAVENOUS

## 2014-04-23 MED ORDER — PROMETHAZINE HCL 25 MG/ML IJ SOLN: 6.2500 mg | INTRAMUSCULAR | Status: DC | PRN

## 2014-04-23 MED ORDER — LORAZEPAM 2 MG/ML IJ SOLN: 1.0000 mg | Freq: Once | INTRAMUSCULAR | Status: DC | PRN

## 2014-04-23 MED ORDER — LIDOCAINE HCL (CARDIAC) 20 MG/ML IV SOLN
INTRAVENOUS | Status: DC | PRN
  Administered 2014-04-23: 40 mg via INTRAVENOUS

## 2014-04-23 MED ORDER — DIAZEPAM 5 MG PO TABS
5.0000 mg | ORAL_TABLET | Freq: Four times a day (QID) | ORAL | Status: DC | PRN
  Administered 2014-04-24 – 2014-04-25 (×3): 5 mg via ORAL
  Filled 2014-04-23 (×5): qty 1

## 2014-04-23 MED ORDER — CITALOPRAM HYDROBROMIDE 10 MG PO TABS
10.0000 mg | ORAL_TABLET | Freq: Every day | ORAL | Status: DC
  Administered 2014-04-24 – 2014-04-25 (×2): 10 mg via ORAL
  Filled 2014-04-23 (×2): qty 1

## 2014-04-23 MED ORDER — PHENYLEPHRINE HCL 10 MG/ML IJ SOLN
10.0000 mg | INTRAVENOUS | Status: DC | PRN
  Administered 2014-04-23: 10 ug/min via INTRAVENOUS

## 2014-04-23 MED ORDER — STERILE WATER FOR INJECTION IJ SOLN
INTRAMUSCULAR | Status: AC
  Filled 2014-04-23: qty 10

## 2014-04-23 MED ORDER — DICYCLOMINE HCL 20 MG PO TABS
20.0000 mg | ORAL_TABLET | Freq: Three times a day (TID) | ORAL | Status: DC | PRN
  Filled 2014-04-23: qty 1

## 2014-04-23 MED ORDER — NEOSTIGMINE METHYLSULFATE 10 MG/10ML IV SOLN
INTRAVENOUS | Status: AC
  Filled 2014-04-23: qty 1

## 2014-04-23 MED ORDER — GLYCOPYRROLATE 0.2 MG/ML IJ SOLN
INTRAMUSCULAR | Status: DC | PRN
  Administered 2014-04-23: 0.6 mg via INTRAVENOUS

## 2014-04-23 MED ORDER — MORPHINE SULFATE 2 MG/ML IJ SOLN
1.0000 mg | INTRAMUSCULAR | Status: DC | PRN
  Administered 2014-04-23 – 2014-04-24 (×6): 2 mg via INTRAVENOUS
  Filled 2014-04-23 (×6): qty 1

## 2014-04-23 MED ORDER — DEXAMETHASONE SODIUM PHOSPHATE 10 MG/ML IJ SOLN
INTRAMUSCULAR | Status: DC | PRN
  Administered 2014-04-23: 10 mg via INTRAVENOUS

## 2014-04-23 MED ORDER — DIAZEPAM 5 MG/ML IJ SOLN
2.5000 mg | Freq: Once | INTRAMUSCULAR | Status: AC
  Administered 2014-04-23: 2.5 mg via INTRAVENOUS

## 2014-04-23 MED ORDER — 0.9 % SODIUM CHLORIDE (POUR BTL) OPTIME
TOPICAL | Status: DC | PRN
  Administered 2014-04-23: 1000 mL

## 2014-04-23 MED ORDER — BUPROPION HCL ER (XL) 150 MG PO TB24
150.0000 mg | ORAL_TABLET | Freq: Every day | ORAL | Status: DC
  Administered 2014-04-24 – 2014-04-25 (×2): 150 mg via ORAL
  Filled 2014-04-23 (×2): qty 1

## 2014-04-23 MED ORDER — ROCURONIUM BROMIDE 100 MG/10ML IV SOLN
INTRAVENOUS | Status: DC | PRN
  Administered 2014-04-23: 40 mg via INTRAVENOUS
  Administered 2014-04-23: 10 mg via INTRAVENOUS

## 2014-04-23 MED ORDER — PROPOFOL 10 MG/ML IV BOLUS
INTRAVENOUS | Status: DC | PRN
  Administered 2014-04-23: 170 mg via INTRAVENOUS

## 2014-04-23 MED ORDER — ACETAMINOPHEN 650 MG RE SUPP: 650.0000 mg | RECTAL | Status: DC | PRN

## 2014-04-23 SURGICAL SUPPLY — 61 items
APL SKNCLS STERI-STRIP NONHPOA (GAUZE/BANDAGES/DRESSINGS) ×1
BENZOIN TINCTURE PRP APPL 2/3 (GAUZE/BANDAGES/DRESSINGS) ×3 IMPLANT
BIT DRILL SM SPINE QC 12 (BIT) ×2 IMPLANT
BLADE ULTRA TIP 2M (BLADE) ×3 IMPLANT
BNDG GAUZE ELAST 4 BULKY (GAUZE/BANDAGES/DRESSINGS) ×6 IMPLANT
BUR BARREL STRAIGHT FLUTE 4.0 (BURR) IMPLANT
BUR MATCHSTICK NEURO 3.0 LAGG (BURR) ×5 IMPLANT
CANISTER SUCT 3000ML (MISCELLANEOUS) ×3 IMPLANT
CLOSURE WOUND 1/2 X4 (GAUZE/BANDAGES/DRESSINGS) ×1
CONT SPEC 4OZ CLIKSEAL STRL BL (MISCELLANEOUS) ×3 IMPLANT
COVER MAYO STAND STRL (DRAPES) ×3 IMPLANT
DRAIN JACKSON PRATT 10MM FLAT (MISCELLANEOUS) ×2 IMPLANT
DRAPE C-ARM 42X72 X-RAY (DRAPES) ×6 IMPLANT
DRAPE LAPAROTOMY 100X72 PEDS (DRAPES) ×3 IMPLANT
DRAPE MICROSCOPE LEICA (MISCELLANEOUS) ×3 IMPLANT
DRAPE POUCH INSTRU U-SHP 10X18 (DRAPES) ×3 IMPLANT
DRAPE PROXIMA HALF (DRAPES) IMPLANT
DRSG OPSITE POSTOP 4X6 (GAUZE/BANDAGES/DRESSINGS) ×2 IMPLANT
DURAPREP 6ML APPLICATOR 50/CS (WOUND CARE) ×3 IMPLANT
ELECT REM PT RETURN 9FT ADLT (ELECTROSURGICAL) ×3
ELECTRODE REM PT RTRN 9FT ADLT (ELECTROSURGICAL) ×1 IMPLANT
EVACUATOR SILICONE 100CC (DRAIN) ×2 IMPLANT
GAUZE SPONGE 4X4 12PLY STRL (GAUZE/BANDAGES/DRESSINGS) ×3 IMPLANT
GAUZE SPONGE 4X4 16PLY XRAY LF (GAUZE/BANDAGES/DRESSINGS) IMPLANT
GLOVE BIOGEL M 8.0 STRL (GLOVE) ×3 IMPLANT
GLOVE EXAM NITRILE LRG STRL (GLOVE) IMPLANT
GLOVE EXAM NITRILE MD LF STRL (GLOVE) IMPLANT
GLOVE EXAM NITRILE XL STR (GLOVE) IMPLANT
GLOVE EXAM NITRILE XS STR PU (GLOVE) IMPLANT
GOWN STRL REUS W/ TWL LRG LVL3 (GOWN DISPOSABLE) ×1 IMPLANT
GOWN STRL REUS W/ TWL XL LVL3 (GOWN DISPOSABLE) IMPLANT
GOWN STRL REUS W/TWL 2XL LVL3 (GOWN DISPOSABLE) IMPLANT
GOWN STRL REUS W/TWL LRG LVL3 (GOWN DISPOSABLE) ×3
GOWN STRL REUS W/TWL XL LVL3 (GOWN DISPOSABLE)
HALTER HD/CHIN CERV TRACTION D (MISCELLANEOUS) ×3 IMPLANT
HEMOSTAT POWDER KIT SURGIFOAM (HEMOSTASIS) IMPLANT
KIT BASIN OR (CUSTOM PROCEDURE TRAY) ×3 IMPLANT
KIT ROOM TURNOVER OR (KITS) ×3 IMPLANT
NDL SPNL 22GX3.5 QUINCKE BK (NEEDLE) ×1 IMPLANT
NEEDLE SPNL 22GX3.5 QUINCKE BK (NEEDLE) ×3 IMPLANT
NS IRRIG 1000ML POUR BTL (IV SOLUTION) ×3 IMPLANT
PACK LAMINECTOMY NEURO (CUSTOM PROCEDURE TRAY) ×3 IMPLANT
PATTIES SURGICAL .5 X.5 (GAUZE/BANDAGES/DRESSINGS) ×2 IMPLANT
PATTIES SURGICAL .5 X1 (DISPOSABLE) ×3 IMPLANT
PLATE ANT CERV XTEND 4 LV 63 (Plate) ×2 IMPLANT
PUTTY DBX 1CC (Putty) ×3 IMPLANT
PUTTY DBX 1CC DEPUY (Putty) IMPLANT
RUBBERBAND STERILE (MISCELLANEOUS) ×10 IMPLANT
SCREW XTD VAR 4.2 SELF TAP 12 (Screw) ×16 IMPLANT
SCREW XTEND SELF DRILL 4.6X12 (Screw) ×4 IMPLANT
SPACER ACDF SM LORDOTIC 7 (Spacer) ×4 IMPLANT
SPACER COLONIAL SZ 6-7 (Spacer) ×4 IMPLANT
SPONGE INTESTINAL PEANUT (DISPOSABLE) ×6 IMPLANT
SPONGE SURGIFOAM ABS GEL 100 (HEMOSTASIS) ×3 IMPLANT
STRIP CLOSURE SKIN 1/2X4 (GAUZE/BANDAGES/DRESSINGS) ×2 IMPLANT
SUT VIC AB 3-0 SH 8-18 (SUTURE) ×3 IMPLANT
SYR 20ML ECCENTRIC (SYRINGE) ×3 IMPLANT
SYR BULB IRRIGATION 50ML (SYRINGE) ×2 IMPLANT
TOWEL OR 17X24 6PK STRL BLUE (TOWEL DISPOSABLE) ×3 IMPLANT
TOWEL OR 17X26 10 PK STRL BLUE (TOWEL DISPOSABLE) ×3 IMPLANT
WATER STERILE IRR 1000ML POUR (IV SOLUTION) ×3 IMPLANT

## 2014-04-23 NOTE — Anesthesia Postprocedure Evaluation (Signed)
  Anesthesia Post-op Note  Patient: Kirsten Benson  Procedure(s) Performed: Procedure(s) with comments: Cervical Three to Four, Cervical Four to Five, Cervical Five to Six, Cervical Six to Seven Anterior cervical decompression/diskectomy/fusion (N/A) - C3-4 C4-5 C5-6 C6-7 Anterior cervical decompression/diskectomy/fusion  Patient Location: PACU  Anesthesia Type:General  Level of Consciousness: awake  Airway and Oxygen Therapy: Patient Spontanous Breathing  Post-op Pain: mild  Post-op Assessment: Post-op Vital signs reviewed, Patient's Cardiovascular Status Stable, Respiratory Function Stable, Patent Airway, No signs of Nausea or vomiting and Pain level controlled  Post-op Vital Signs: Reviewed and stable  Last Vitals:  Filed Vitals:   04/23/14 2030  BP: 100/54  Pulse: 87  Temp: 37 C  Resp: 7    Complications: No apparent anesthesia complications

## 2014-04-23 NOTE — Transfer of Care (Signed)
Immediate Anesthesia Transfer of Care Note  Patient: Kirsten Benson  Procedure(s) Performed: Procedure(s) with comments: Cervical Three to Four, Cervical Four to Five, Cervical Five to Six, Cervical Six to Seven Anterior cervical decompression/diskectomy/fusion (N/A) - C3-4 C4-5 C5-6 C6-7 Anterior cervical decompression/diskectomy/fusion  Patient Location: PACU  Anesthesia Type:General  Level of Consciousness: awake  Airway & Oxygen Therapy: Patient Spontanous Breathing and Patient connected to face mask oxygen  Post-op Assessment: Report given to PACU RN and Post -op Vital signs reviewed and stable  Post vital signs: Reviewed and stable  Complications: No apparent anesthesia complications

## 2014-04-23 NOTE — Anesthesia Preprocedure Evaluation (Signed)
Anesthesia Evaluation  Patient identified by MRN, date of birth, ID band Patient awake    Reviewed: Allergy & Precautions, H&P , NPO status , Patient's Chart, lab work & pertinent test results  Airway Mallampati: I  TM Distance: >3 FB Neck ROM: Full    Dental  (+) Teeth Intact, Dental Advisory Given   Pulmonary Current Smoker,          Cardiovascular     Neuro/Psych Anxiety    GI/Hepatic GERD-  ,  Endo/Other    Renal/GU      Musculoskeletal   Abdominal   Peds  Hematology   Anesthesia Other Findings   Reproductive/Obstetrics                             Anesthesia Physical Anesthesia Plan  ASA: II  Anesthesia Plan: General   Post-op Pain Management:    Induction: Intravenous  Airway Management Planned: Oral ETT  Additional Equipment:   Intra-op Plan:   Post-operative Plan: Extubation in OR  Informed Consent: I have reviewed the patients History and Physical, chart, labs and discussed the procedure including the risks, benefits and alternatives for the proposed anesthesia with the patient or authorized representative who has indicated his/her understanding and acceptance.   Dental advisory given, History available from chart only, Only emergency history available and Consent reviewed with POA  Plan Discussed with: CRNA, Anesthesiologist and Surgeon  Anesthesia Plan Comments:         Anesthesia Quick Evaluation

## 2014-04-23 NOTE — Anesthesia Procedure Notes (Signed)
Procedure Name: Intubation Date/Time: 04/23/2014 2:44 PM Performed by: Maude Leriche D Pre-anesthesia Checklist: Patient identified, Emergency Drugs available, Suction available, Patient being monitored and Timeout performed Patient Re-evaluated:Patient Re-evaluated prior to inductionOxygen Delivery Method: Circle system utilized Preoxygenation: Pre-oxygenation with 100% oxygen Intubation Type: IV induction Ventilation: Mask ventilation without difficulty and Oral airway inserted - appropriate to patient size Laryngoscope Size: Miller and 2 Grade View: Grade I Tube type: Oral Tube size: 7.5 mm Number of attempts: 1 Airway Equipment and Method: Stylet Placement Confirmation: ETT inserted through vocal cords under direct vision,  positive ETCO2 and breath sounds checked- equal and bilateral Secured at: 21 cm Tube secured with: Tape Dental Injury: Teeth and Oropharynx as per pre-operative assessment

## 2014-04-23 NOTE — Progress Notes (Signed)
Orthopedic Tech Progress Note Patient Details:  Kirsten Benson [birth year] [id number]  Ortho Devices Type of Ortho Device: Soft collar Ortho Device/Splint Location: neck Ortho Device/Splint Interventions: Ordered, Application   Braulio Bosch 04/23/2014, 7:47 PM

## 2014-04-23 NOTE — OR Nursing (Signed)
Pt is very anxious, crying, c/o of posterior neck pain.  Dr. Joya Salm at bedside. Order received for 2.5 mg Valium IV.

## 2014-04-23 NOTE — Progress Notes (Signed)
Dr.Botero came in to speak with pt in Short Stay about surgery d/t pt having questions about the plate that was to be put in.Pt states she now understands what is going to be done and states,"she wants to proceed with her surgery today"

## 2014-04-24 NOTE — Progress Notes (Signed)
Subjective: Patient reports sore in neck  Objective: Vital signs in last 24 hours: Temp:  [98.1 F (36.7 C)-98.8 F (37.1 C)] 98.5 F (36.9 C) (11/14 0300) Pulse Rate:  [64-101] 68 (11/14 0300) Resp:  [7-21] 18 (11/14 0300) BP: (99-121)/(54-72) 106/60 mmHg (11/14 0300) SpO2:  [95 %-99 %] 98 % (11/14 0300) Weight:  [61.236 kg (135 lb)-66.9 kg (147 lb 7.8 oz)] 66.9 kg (147 lb 7.8 oz) (11/13 2115)  Intake/Output from previous day: 11/13 0701 - 11/14 0700 In: 1800 [I.V.:1800] Out: 1105 [Urine:750; Drains:55; Blood:300] Intake/Output this shift: Total I/O In: 300 [I.V.:300] Out: 480 [Urine:425; Drains:55]  Physical Exam: Strength full both arms.  Dressing CDI.  Lab Results:  Recent Labs  04/21/14 1550  WBC 6.4  HGB 13.1  HCT 39.4  PLT 221   BMET  Recent Labs  04/21/14 1550  NA 140  K 3.9  CL 104  CO2 24  GLUCOSE 80  BUN 13  CREATININE 0.69  CALCIUM 9.4    Studies/Results: Dg Cervical Spine 2-3 Views  04/23/2014   CLINICAL DATA:  C3 through C7 ACDF.  Perioperative.  EXAM: CERVICAL SPINE - 2-3 VIEW  COMPARISON:  CT cervical spine with contrast 10/26/2013  FINDINGS: Two cross-table lateral views of the cervical spine are submitted. Patient is intubated.  Image labeled "number 1" demonstrates a needle projecting over the anterior aspect of the C4-C5 disc space. Image labeled "number 2" demonstrates postoperative changes of anterior cervical discectomy and fusion spanning C3, C4, C5, C6, and possibly C7. The C7 vertebral body is obscured by the patient's shoulders.  IMPRESSION: Intraoperative cross-table lateral views of the cervical spine as described above.   Electronically Signed   By: Curlene Dolphin M.D.   On: 04/23/2014 19:08    Assessment/Plan: Quite uncomfortable.  Mobilize today and keep drain in place.    LOS: 1 day    Liviana Mills D, MD 04/24/2014, 7:00 AM
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
## 2014-04-24 NOTE — Progress Notes (Signed)
Patient arrived to 4N15 from PACU alert and responsive at 2100.
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
## 2014-04-25 MED ORDER — DIAZEPAM 5 MG PO TABS: 5.0000 mg | ORAL_TABLET | Freq: Four times a day (QID) | ORAL | Status: DC | PRN

## 2014-04-25 MED ORDER — OXYCODONE-ACETAMINOPHEN 5-325 MG PO TABS: 1.0000 | ORAL_TABLET | Freq: Four times a day (QID) | ORAL | Status: DC | PRN

## 2014-04-25 MED ORDER — CYCLOBENZAPRINE HCL 10 MG PO TABS: 10.0000 mg | ORAL_TABLET | Freq: Three times a day (TID) | ORAL | Status: DC | PRN

## 2014-04-25 MED ORDER — BISACODYL 5 MG PO TBEC: 5.0000 mg | DELAYED_RELEASE_TABLET | Freq: Every day | ORAL | Status: DC | PRN

## 2014-04-25 NOTE — Discharge Summary (Signed)
Physician Discharge Summary  Patient ID: Kirsten Benson MRN: [id number] DOB/AGE: 1971/04/28 43 y.o.  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  Radiculopathy.     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  Radiculopathy.    Active Problems:   Cervical stenosis of spinal canal   Discharged Condition: good  Hospital Course: Mrs. Gail was admitted and taken to the operating room for an uncomplicated ACDF from P9-1. Post op she is ambulating, voiding, and tolerating a regular diet She is moving well. Voice is strong. Wound is clean, dry, without signs of infection.  Good strength in the upper extremities. Expected neck pain.   Treatments: surgery: Anterior C3-4, 4-5, 5-6 and C6-7 diskectomy, decompression of the spinal cord, bilateral foraminotomy, interbody fusion with cage, plate from T0-V6.  Microscope.   Discharge Exam: Blood pressure 117/79, pulse 73, temperature 98.2 F (36.8 C), temperature source Oral, resp. rate 18, height 5\' 3"  (1.6 m), weight 66.9 kg (147 lb 7.8 oz), SpO2 100 %. General appearance: alert, cooperative and appears stated age Neurologic: Alert and oriented X 3, normal strength and tone. Normal symmetric reflexes. Normal coordination and gait  Disposition: 01-Home or Self Care Cervical radiculopathy, Cervical stenosis    Medication List    STOP taking these medications        naproxen 500 MG tablet  Commonly known as:  NAPROSYN      TAKE these medications        buPROPion 150 MG 24 hr tablet  Commonly known as:  WELLBUTRIN XL  Take 150 mg by mouth daily.     citalopram 10 MG tablet  Commonly known as:  CELEXA  Take 10 mg by mouth daily.     cyclobenzaprine 10 MG tablet  Commonly known as:  FLEXERIL  Take 1 tablet (10 mg total) by mouth 2 (two) times daily as needed for  muscle spasms.     cyclobenzaprine 10 MG tablet  Commonly known as:  FLEXERIL  Take 1 tablet (10 mg total) by mouth 3 (three) times daily as needed for muscle spasms.     dicyclomine 20 MG tablet  Commonly known as:  BENTYL  Take 20 mg by mouth 3 (three) times daily as needed for spasms.     famotidine 20 MG tablet  Commonly known as:  PEPCID  Take 1 tablet (20 mg total) by mouth 2 (two) times daily.     LORTAB 10-325 MG per tablet  Generic drug:  HYDROcodone-acetaminophen  Take 1 tablet by mouth every 6 (six) hours as needed for severe pain.     HYDROcodone-acetaminophen 5-325 MG per tablet  Commonly known as:  NORCO/VICODIN  Take 1-2 tablets by mouth every 6 (six) hours as needed for pain.     HYDROcodone-acetaminophen 5-325 MG per tablet  Commonly known as:  NORCO/VICODIN  Take 2 tablets by mouth every 4 (four) hours as needed for pain.     ibuprofen 800 MG tablet  Commonly known as:  ADVIL,MOTRIN  Take 1 tablet (800 mg total) by mouth 3 (three) times daily.     multivitamin with minerals Tabs tablet  Take 1 tablet by mouth daily.     ondansetron 4 MG tablet  Commonly known as:  ZOFRAN  Take 1 tablet (4 mg total) by mouth every 6 (six) hours.     oxyCODONE-acetaminophen 5-325 MG per tablet  Commonly known as:  ROXICET  Take 1 tablet by mouth every 6 (six) hours as needed for severe pain.     varenicline 1 MG tablet  Commonly known as:  CHANTIX  Take 1 mg by mouth 2 (two) times daily.           Follow-up Information    Follow up with Floyce Stakes, MD. Schedule an appointment as soon as possible for a visit in 3 weeks.   Specialty:  Neurosurgery   Contact information:   [place] [id number] [phone]       Signed: Areliz Rothman L 04/25/2014, 11:09 AM

## 2014-04-25 NOTE — Discharge Instructions (Signed)
Anterior Cervical Diskectomy and Fusion °Anterior cervical diskectomy is surgery done on the upper spine to relieve pressure on one or more nerve roots, or on the spinal cord. There are 7 bones in your neck, called the cervical spine. These 7 bones (vertebrae) sit one on top of the other. Cushions (intervertebral disks) separate the vertebrae and act like shock absorbers. As we age, degeneration of our bones, joints, and disks can cause neck pain and tightening around the spinal cord and nerve roots. This causes arm pain and weakness.  °Degeneration involves: °· Herniated Disk. With age, the disks dry up and can rupture. In this condition, the center of the disk bulges out (disk herniation). This can cause pressure on a nerve, which produces pain or weakness in the arm. °· Bone spurs and spinal stenosis. As we age, growths often develop on our bones. These growths are called bone spurs (osteophytes). A bone spur is a collection of calcium. As bone spurs grow and extend, the vertebral openings become narrow. The spinal canal and/or the foramen (opening for nerve passageways) become smaller. This narrowing (stenosis) may cause pinching (compression) of the spinal cord or the spinal nerve root. The nerve injury can cause pain, weakness, numbness, and loss of coordination in the upper limbs. Often, patients have difficulty with their hand writing or they start dropping things, because their hand grip is weaker. The spinal cord damage can cause increased stiffness, more frequent falls, electric shooting pain, and changes in bowel and bladder control. °Degeneration in the neck results in three common problems: °· Radiculopathy - Nerve compression that results in weakness or pain that radiates down the arm. °· Myelopathy - Spinal cord compression that causes stiffness, difficulty with walking, coordination, and trouble with bowel or bladder habits. °· Neck pain - Worn out joints cause pain as the neck  moves. °Treatment: °· Radiculopathy - Surgery is performed to remove the bony and disk material that is pushing on the nerve. °· Myelopathy - Surgery is performed to remove the bony and disk material that pushes on the spinal cord. °· Neck pain - Surgery is performed to combine (fuse) the joints of the neck together, so they cannot move or cause pain. °Surgery can be done from the front or the back of the neck. When it is done from the front, it is called an anterior (front) cervical (neck) diskectomy (removal of the disk) and fusion. °LET YOUR CAREGIVER KNOW ABOUT:  °· Recent infections. °· Any shooting pains down your leg, when you move your neck. °· Any difficulty swallowing. °· A smoking history. °· Use of blood thinners or anti-inflammatory medicines. °· Any history of injury to your shoulders. °· Any history of injury to your vocal cords. °· Any foreign objects in your body from a previous surgery. °· Any recent fevers or illness. °· Past medical history (diabetes, strokes). °· Past problems with anesthetics. °· Possibility of pregnancy. °· History of blood clots (deep vein thrombosis). °· History of bleeding or blood problems. °· Past surgeries. °· Other health problems. °· Allergies. °· Medicines you take, including herbs, eye drops, over-the-counter medicines, and creams. °· Use of steroids (by mouth or creams). °RISKS AND COMPLICATIONS °· Infection. °· Bleeding. °· Injury to the following structures: °¨ Carotid artery. This can result in a stroke or significant amount of bleeding. °¨ Esophagus, resulting in difficulty swallowing. °¨ Recurrent laryngeal nerve, resulting in hoarseness of the voice. °¨ Spinal cord injury, ranging from mild to complete quadriparesis (muscle weakness in   all four limbs). °¨ Nerve root injury, resulting in muscle weakness in the upper limb. °¨ Leakage of cerebrospinal fluid. °BEFORE THE PROCEDURE  °· You will be given medicine to help you sleep (general anesthetic), and a  breathing tube will be placed. °· You will be given antibiotics to keep the infection rate down. °· The incision site on your neck will be marked. °· Your neck will be cleaned, to reduce the risk of infection. °PROCEDURE  °An anterior cervical fusion means that the operation is done through the front (anterior) part of your neck. The cut made by the surgeon (incision) is usually within a skin fold line on the neck. After pushing aside the neck muscles, the surgeon removes the affected, degenerated disk and bone spurs (osteophytes), which takes the pressure off the nerves and spinal cord. This is called a decompression. The area where the disk was removed is then filled with a small piece of plastic. This plastic takes the place of the disk and keeps the nerve passageway (foramen) open and clear for the nerves. In most cases, the surgeon uses metal plates or pins (hardware) in the neck, to help stabilize the level being fused. The hardware reduces motion at that level, so it can fuse. This provides extra support to the neck. A cervical fusion procedure takes anywhere from a couple to several hours, depending on the size of the neck, history of previous surgery, and number of levels being fused. °AFTER THE PROCEDURE  °· You will likely spend 24-48 hours in the hospital. During this time, your caregivers will look for any signs of complications from the procedure. °· Your caregiver will watch you, to make sure that fluid draining from the surgery slows down. It is important that a large mass of blood does not form in your neck, which would cause difficulty with breathing. °· You will get 24 hours of antibiotics. °· You can start to eat as soon as you feel comfortable. °· Once you have started eating, walking, urinating (voiding) and having bowel movements on your own, your caregiver will discharge you home. °HOME CARE INSTRUCTIONS  °· For 2 weeks, do not soak the incision site under water. Do not swim or take baths.  Showers are okay, but rinse off the incision sites. °· Do not over exert yourself. Allow time for the incision to heal. °· It can take from 6 weeks to 6 months for fusion to take effect. Your caregiver may ask you to wear a neck collar during this time, as they check the fusion with multiple (serial) X-rays. °Document Released: 05/16/2009 Document Revised: 09/22/2012 Document Reviewed: 05/16/2009 °[employer]® Patient Information ©2015 [employer]. This information is not intended to replace advice given to you by your health care provider. Make sure you discuss any questions you have with your health care provider. ° °

## 2014-04-25 NOTE — Progress Notes (Signed)
Reviewed all discharge with patient and family member. Pt is to follow up with Dr. Joya Salm in 3 weeks. Transportation to patient's home in [place] provided by family.

## 2014-04-25 NOTE — Plan of Care (Signed)
Problem: Acute Rehab PT Goals(only PT should resolve) Goal: Pt Will Go Supine/Side To Sit Outcome: Completed/Met Date Met:  04/25/14 Goal: Pt Will Go Sit To Supine/Side Outcome: Completed/Met Date Met:  04/25/14 Goal: Patient Will Transfer Sit To/From Stand Outcome: Completed/Met Date Met:  04/25/14 Goal: Pt Will Ambulate Outcome: Completed/Met Date Met:  04/25/14 Goal: Pt Will Go Up/Down Stairs Outcome: Completed/Met Date Met:  04/25/14
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
## 2014-04-26 ENCOUNTER — Encounter (HOSPITAL_COMMUNITY): Payer: Self-pay | Admitting: Neurosurgery

## 2014-04-26 NOTE — Progress Notes (Signed)
RETRO / UR COMPLETED 

## 2015-08-02 ENCOUNTER — Other Ambulatory Visit (HOSPITAL_COMMUNITY): Payer: Self-pay | Admitting: Specialist

## 2015-08-02 DIAGNOSIS — Z302 Encounter for sterilization: Secondary | ICD-10-CM

## 2015-08-08 ENCOUNTER — Ambulatory Visit (HOSPITAL_COMMUNITY): Admission: RE | Admit: 2015-08-08 | Payer: Self-pay | Source: Ambulatory Visit

## 2016-10-03 ENCOUNTER — Encounter (HOSPITAL_COMMUNITY): Payer: Self-pay | Admitting: Emergency Medicine

## 2016-10-03 ENCOUNTER — Emergency Department (HOSPITAL_COMMUNITY)
Admission: EM | Admit: 2016-10-03 | Discharge: 2016-10-03 | Disposition: A | Discharge status: Home / Self Care | Payer: Self-pay | Attending: Emergency Medicine | Admitting: Emergency Medicine

## 2016-10-03 ENCOUNTER — Emergency Department (HOSPITAL_COMMUNITY): Payer: Self-pay

## 2016-10-03 DIAGNOSIS — R519 Headache, unspecified: Secondary | ICD-10-CM

## 2016-10-03 DIAGNOSIS — F17210 Nicotine dependence, cigarettes, uncomplicated: Secondary | ICD-10-CM | POA: Insufficient documentation

## 2016-10-03 DIAGNOSIS — R51 Headache: Secondary | ICD-10-CM | POA: Insufficient documentation

## 2016-10-03 DIAGNOSIS — J45909 Unspecified asthma, uncomplicated: Secondary | ICD-10-CM | POA: Insufficient documentation

## 2016-10-03 DIAGNOSIS — Z79899 Other long term (current) drug therapy: Secondary | ICD-10-CM | POA: Insufficient documentation

## 2016-10-03 MED ORDER — SODIUM CHLORIDE 0.9 % IV BOLUS (SEPSIS)
1000.0000 mL | Freq: Once | INTRAVENOUS | Status: AC
  Administered 2016-10-03: 1000 mL via INTRAVENOUS

## 2016-10-03 MED ORDER — METOCLOPRAMIDE HCL 5 MG/ML IJ SOLN
10.0000 mg | Freq: Once | INTRAMUSCULAR | Status: AC
  Administered 2016-10-03: 10 mg via INTRAVENOUS
  Filled 2016-10-03: qty 2

## 2016-10-03 MED ORDER — DEXAMETHASONE SODIUM PHOSPHATE 10 MG/ML IJ SOLN
10.0000 mg | Freq: Once | INTRAMUSCULAR | Status: AC
  Administered 2016-10-03: 10 mg via INTRAVENOUS
  Filled 2016-10-03: qty 1

## 2016-10-03 MED ORDER — DIPHENHYDRAMINE HCL 50 MG/ML IJ SOLN
25.0000 mg | Freq: Once | INTRAMUSCULAR | Status: AC
  Administered 2016-10-03: 25 mg via INTRAVENOUS
  Filled 2016-10-03: qty 1

## 2016-10-03 NOTE — ED Notes (Signed)
Pt resting comfortably in bed

## 2016-10-03 NOTE — ED Provider Notes (Signed)
[facility] DEPT Provider Note   CSN: [id number] Arrival date & time: 10/03/16  8413     History   Chief Complaint Chief Complaint  Patient presents with  . Headache    HPI Kirsten Benson is a 46 y.o. female.  Kirsten Benson is a 46 y.o. Female who presents to the emergency department complaining of a headache with onset around 12:30 this morning. Patient reports she woke up with this headache around 12:30 AM. She reports she was able to fall back asleep and then woke up again and her headache was still present. She complains of a 9 out of 10 frontal headache. She has taken Tylenol with little relief. She denies sudden onset headache. She denies head injury or anticoagulation use. She reports some nausea but no vomiting. She does report a history of headaches previously and reports this feels like previous migraines. She denies fevers, double vision, neck pain, neck stiffness, chest pain, shortness of breath, abdominal pain, vomiting, numbness, tingling, weakness or head injury.   The history is provided by the patient and medical records. No language interpreter was used.  Headache   Associated symptoms include nausea. Pertinent negatives include no fever, no shortness of breath and no vomiting.    Past Medical History:  Diagnosis Date  . Anxiety   . Asthma    occ  . Cough   . Depression   . GERD (gastroesophageal reflux disease)   . Headache     Patient Active Problem List   Diagnosis Date Noted  . Cervical stenosis of spinal canal 04/23/2014    Past Surgical History:  Procedure Laterality Date  . ANTERIOR CERVICAL DECOMPRESSION/DISCECTOMY FUSION 4 LEVELS N/A 04/23/2014   Procedure: Cervical Three to Four, Cervical Four to Five, Cervical Five to Six, Cervical Six to Seven Anterior cervical decompression/diskectomy/fusion;  Surgeon: Floyce Stakes, MD;  Location: [facility];  Service: Neurosurgery;  Laterality: N/A;  C3-4 C4-5 C5-6 C6-7 Anterior cervical  decompression/diskectomy/fusion  . TUBAL LIGATION      OB History    No data available       Home Medications    Prior to Admission medications   Medication Sig Start Date End Date Taking? Authorizing Provider  acetaminophen (TYLENOL) 325 MG tablet Take 650 mg by mouth every 6 (six) hours as needed for headache.   Yes Historical Provider, MD  cyclobenzaprine (FLEXERIL) 10 MG tablet Take 1 tablet (10 mg total) by mouth 2 (two) times daily as needed for muscle spasms. Patient not taking: Reported on 10/03/2016 10/06/12   Fredia Sorrow, MD  cyclobenzaprine (FLEXERIL) 10 MG tablet Take 1 tablet (10 mg total) by mouth 3 (three) times daily as needed for muscle spasms. Patient not taking: Reported on 10/03/2016 04/25/14   Ashok Pall, MD  diazepam (VALIUM) 5 MG tablet Take 1 tablet (5 mg total) by mouth every 6 (six) hours as needed for muscle spasms. Patient not taking: Reported on 10/03/2016 04/25/14   Ashok Pall, MD  famotidine (PEPCID) 20 MG tablet Take 1 tablet (20 mg total) by mouth 2 (two) times daily. Patient not taking: Reported on 10/03/2016 08/14/12   Junius Creamer, NP  HYDROcodone-acetaminophen (NORCO/VICODIN) 5-325 MG per tablet Take 1-2 tablets by mouth every 6 (six) hours as needed for pain. Patient not taking: Reported on 04/21/2014 10/06/12   Fredia Sorrow, MD  HYDROcodone-acetaminophen (NORCO/VICODIN) 5-325 MG per tablet Take 2 tablets by mouth every 4 (four) hours as needed for pain. Patient not taking: Reported on 04/21/2014  01/09/13   Fransico Meadow, PA-C  ibuprofen (ADVIL,MOTRIN) 800 MG tablet Take 1 tablet (800 mg total) by mouth 3 (three) times daily. Patient not taking: Reported on 04/21/2014 01/09/13   Fransico Meadow, PA-C  ondansetron (ZOFRAN) 4 MG tablet Take 1 tablet (4 mg total) by mouth every 6 (six) hours. Patient not taking: Reported on 04/21/2014 08/14/12   Junius Creamer, NP  oxyCODONE-acetaminophen (ROXICET) 5-325 MG per tablet Take 1 tablet by mouth every 6 (six)  hours as needed for severe pain. Patient not taking: Reported on 10/03/2016 04/25/14   Ashok Pall, MD    Family History No family history on file.  Social History Social History  Substance Use Topics  . Smoking status: Current Every Day Smoker    Packs/day: 0.25    Years: 15.00    Types: Cigarettes  . Smokeless tobacco: Never Used  . Alcohol use Yes     Comment: social     Allergies   Patient has no known allergies.   Review of Systems Review of Systems  Constitutional: Negative for chills and fever.  HENT: Negative for congestion and sore throat.   Eyes: Negative for pain and visual disturbance.  Respiratory: Negative for cough and shortness of breath.   Cardiovascular: Negative for chest pain.  Gastrointestinal: Positive for nausea. Negative for abdominal pain, diarrhea and vomiting.  Genitourinary: Negative for dysuria.  Musculoskeletal: Negative for back pain and neck pain.  Skin: Negative for rash.  Neurological: Positive for headaches. Negative for dizziness, syncope, weakness, light-headedness and numbness.     Physical Exam Updated Vital Signs BP 104/71   Pulse (!) 54   Temp 97.8 F (36.6 C)   Resp 16   SpO2 98%   Physical Exam  Constitutional: She is oriented to person, place, and time. She appears well-developed and well-nourished. No distress.  Nontoxic appearing.  HENT:  Head: Normocephalic and atraumatic.  Right Ear: External ear normal.  Left Ear: External ear normal.  Mouth/Throat: Oropharynx is clear and moist.  Bilateral tympanic membranes are pearly-gray without erythema or loss of landmarks.  No temporal edema or tenderness.  Eyes: Conjunctivae and EOM are normal. Pupils are equal, round, and reactive to light. Right eye exhibits no discharge. Left eye exhibits no discharge.  Neck: Normal range of motion. Neck supple. No JVD present. No tracheal deviation present.  No meningeal signs.  Cardiovascular: Normal rate, regular rhythm, normal  heart sounds and intact distal pulses.  Exam reveals no gallop and no friction rub.   No murmur heard. Pulmonary/Chest: Effort normal and breath sounds normal. No stridor. No respiratory distress. She has no wheezes. She has no rales.  Abdominal: Soft. There is no tenderness.  Musculoskeletal: Normal range of motion. She exhibits no edema or tenderness.  No lower extremity edema or tenderness.  Lymphadenopathy:    She has no cervical adenopathy.  Neurological: She is alert and oriented to person, place, and time. No cranial nerve deficit or sensory deficit. She exhibits normal muscle tone. Coordination normal.  Patient is alert and oriented 3. Cranial nerves are intact. Speech is clear and coherent. No pronator drift. Finger to nose intact bilaterally. Normal gait. Sensation is intact her bilateral upper and lower extremities.  Skin: Skin is warm and dry. Capillary refill takes less than 2 seconds. No rash noted. She is not diaphoretic. No erythema. No pallor.  Psychiatric: She has a normal mood and affect. Her behavior is normal.  Nursing note and vitals reviewed.  ED Treatments / Results  Labs (all labs ordered are listed, but only abnormal results are displayed) Labs Reviewed - No data to display  EKG  EKG Interpretation None       Radiology Ct Head Wo Contrast  Result Date: 10/03/2016 CLINICAL DATA:  Severe headache beginning at 12:30 last night. EXAM: CT HEAD WITHOUT CONTRAST TECHNIQUE: Contiguous axial images were obtained from the base of the skull through the vertex without intravenous contrast. COMPARISON:  None. FINDINGS: Brain: Appears normal without hemorrhage, infarct, mass lesion, mass effect, midline shift or abnormal extra-axial fluid collection. No hydrocephalus or pneumocephalus. Vascular: Negative. Skull: Intact. Sinuses/Orbits: Negative. Other: None. IMPRESSION: Negative head CT. Electronically Signed   By: Inge Rise M.D.   On: 10/03/2016 07:41     Procedures Procedures (including critical care time)  Medications Ordered in ED Medications  sodium chloride 0.9 % bolus 1,000 mL (0 mLs Intravenous Stopped 10/03/16 0903)  metoCLOPramide (REGLAN) injection 10 mg (10 mg Intravenous Given 10/03/16 0708)  diphenhydrAMINE (BENADRYL) injection 25 mg (25 mg Intravenous Given 10/03/16 0702)  dexamethasone (DECADRON) injection 10 mg (10 mg Intravenous Given 10/03/16 0702)     Initial Impression / Assessment and Plan / ED Course  I have reviewed the triage vital signs and the nursing notes.  Pertinent labs & imaging results that were available during my care of the patient were reviewed by me and considered in my medical decision making (see chart for details).    This is a 46 y.o. Female who presents to the emergency department complaining of a headache with onset around 12:30 this morning. Patient reports she woke up with this headache around 12:30 AM. She reports she was able to fall back asleep and then woke up again and her headache was still present. She complains of a 9 out of 10 frontal headache. She has taken Tylenol with little relief. She denies sudden onset headache. On exam the patient is afebrile and non-toxic appearing.   Pt HA treated and resolved while in ED.  Presentation is like pts typical HA and non concerning for [facility], ICH, Meningitis, or temporal arteritis. Pt is afebrile with no focal neuro deficits, nuchal rigidity, or change in vision. Pt is to follow up with PCP to discuss prophylactic medication. I advised the patient to follow-up with their primary care provider this week. I advised the patient to return to the emergency department with new or worsening symptoms or new concerns. The patient verbalized understanding and agreement with plan.     Final Clinical Impressions(s) / ED Diagnoses   Final diagnoses:  Bad headache    New Prescriptions New Prescriptions   No medications on file     Waynetta Pean,  Hershal Coria 10/03/16 [place], MD 10/09/16 [phone]

## 2016-10-03 NOTE — ED Notes (Signed)
Pt to CT

## 2016-10-03 NOTE — ED Triage Notes (Signed)
Pt states headache came on around 1230 this morning. "worst headache of her life" no history of migraine. States she has had "episodes" like this a few times in the past but not been seen.  Throbbing pain across forehead and base of skull. Nausea, no vomiting. Took tylenol at home with no relief.  Feels "agitated".

## 2016-10-03 NOTE — ED Notes (Signed)
Pt returned from CT °

## 2018-05-25 ENCOUNTER — Emergency Department (HOSPITAL_COMMUNITY): Payer: BLUE CROSS/BLUE SHIELD

## 2018-05-25 ENCOUNTER — Encounter (HOSPITAL_COMMUNITY): Payer: Self-pay

## 2018-05-25 ENCOUNTER — Emergency Department (HOSPITAL_COMMUNITY)
Admission: EM | Admit: 2018-05-25 | Discharge: 2018-05-25 | Disposition: A | Discharge status: Home / Self Care | Payer: BLUE CROSS/BLUE SHIELD | Attending: Emergency Medicine | Admitting: Emergency Medicine

## 2018-05-25 ENCOUNTER — Other Ambulatory Visit: Payer: Self-pay

## 2018-05-25 DIAGNOSIS — R1013 Epigastric pain: Secondary | ICD-10-CM | POA: Diagnosis present

## 2018-05-25 DIAGNOSIS — R109 Unspecified abdominal pain: Secondary | ICD-10-CM

## 2018-05-25 DIAGNOSIS — J45909 Unspecified asthma, uncomplicated: Secondary | ICD-10-CM | POA: Insufficient documentation

## 2018-05-25 DIAGNOSIS — K529 Noninfective gastroenteritis and colitis, unspecified: Secondary | ICD-10-CM

## 2018-05-25 DIAGNOSIS — F17210 Nicotine dependence, cigarettes, uncomplicated: Secondary | ICD-10-CM | POA: Insufficient documentation

## 2018-05-25 LAB — URINALYSIS, ROUTINE W REFLEX MICROSCOPIC
BACTERIA UA: NONE SEEN
BILIRUBIN URINE: NEGATIVE
GLUCOSE, UA: 50 mg/dL — AB
Ketones, ur: 80 mg/dL — AB
LEUKOCYTES UA: NEGATIVE
NITRITE: NEGATIVE
PROTEIN: 100 mg/dL — AB
Specific Gravity, Urine: 1.028 (ref 1.005–1.030)
pH: 6 (ref 5.0–8.0)

## 2018-05-25 LAB — COMPREHENSIVE METABOLIC PANEL
ALT: 15 U/L (ref 0–44)
AST: 18 U/L (ref 15–41)
Albumin: 4.5 g/dL (ref 3.5–5.0)
Alkaline Phosphatase: 44 U/L (ref 38–126)
Anion gap: 9 (ref 5–15)
BUN: 17 mg/dL (ref 6–20)
CHLORIDE: 109 mmol/L (ref 98–111)
CO2: 24 mmol/L (ref 22–32)
Calcium: 9.1 mg/dL (ref 8.9–10.3)
Creatinine, Ser: 0.71 mg/dL (ref 0.44–1.00)
Glucose, Bld: 137 mg/dL — ABNORMAL HIGH (ref 70–99)
Potassium: 4 mmol/L (ref 3.5–5.1)
SODIUM: 142 mmol/L (ref 135–145)
Total Bilirubin: 0.6 mg/dL (ref 0.3–1.2)
Total Protein: 6.9 g/dL (ref 6.5–8.1)

## 2018-05-25 LAB — CBC
HEMATOCRIT: 41.4 % (ref 36.0–46.0)
HEMOGLOBIN: 13.7 g/dL (ref 12.0–15.0)
MCH: 31.4 pg (ref 26.0–34.0)
MCHC: 33.1 g/dL (ref 30.0–36.0)
MCV: 95 fL (ref 80.0–100.0)
NRBC: 0 % (ref 0.0–0.2)
Platelets: 225 10*3/uL (ref 150–400)
RBC: 4.36 MIL/uL (ref 3.87–5.11)
RDW: 12.7 % (ref 11.5–15.5)
WBC: 10.7 10*3/uL — AB (ref 4.0–10.5)

## 2018-05-25 LAB — LIPASE, BLOOD: LIPASE: 20 U/L (ref 11–51)

## 2018-05-25 LAB — I-STAT BETA HCG BLOOD, ED (MC, WL, AP ONLY): I-stat hCG, quantitative: <5 m[IU]/mL (ref <5)

## 2018-05-25 MED ORDER — ONDANSETRON 8 MG PO TBDP: 8.0000 mg | ORAL_TABLET | Freq: Three times a day (TID) | ORAL | 0 refills | Status: DC | PRN

## 2018-05-25 MED ORDER — SODIUM CHLORIDE (PF) 0.9 % IJ SOLN
INTRAMUSCULAR | Status: AC
  Filled 2018-05-25: qty 50

## 2018-05-25 MED ORDER — HYDROMORPHONE HCL 1 MG/ML IJ SOLN
1.0000 mg | Freq: Once | INTRAMUSCULAR | Status: AC
  Administered 2018-05-25: 1 mg via INTRAVENOUS
  Filled 2018-05-25: qty 1

## 2018-05-25 MED ORDER — ONDANSETRON HCL 4 MG/2ML IJ SOLN
4.0000 mg | Freq: Once | INTRAMUSCULAR | Status: AC
  Administered 2018-05-25: 4 mg via INTRAVENOUS
  Filled 2018-05-25: qty 2

## 2018-05-25 MED ORDER — CIPROFLOXACIN HCL 500 MG PO TABS: 500.0000 mg | ORAL_TABLET | Freq: Two times a day (BID) | ORAL | 0 refills | Status: DC

## 2018-05-25 MED ORDER — METRONIDAZOLE 500 MG PO TABS: 500.0000 mg | ORAL_TABLET | Freq: Three times a day (TID) | ORAL | 0 refills | Status: DC

## 2018-05-25 MED ORDER — IOPAMIDOL (ISOVUE-300) INJECTION 61%
100.0000 mL | Freq: Once | INTRAVENOUS | Status: AC | PRN
  Administered 2018-05-25: 100 mL via INTRAVENOUS

## 2018-05-25 MED ORDER — IOPAMIDOL (ISOVUE-300) INJECTION 61%
INTRAVENOUS | Status: AC
  Filled 2018-05-25: qty 100

## 2018-05-25 NOTE — Discharge Instructions (Addendum)
Take the medications as prescribed, follow-up with a primary care doctor and/or your gastrointestinal doctor

## 2018-05-25 NOTE — ED Triage Notes (Signed)
Pt reports epigastric pain that started last night. She states that it has worsened through the night and is now unbearable. Pt endorses vomiting throughout the night and loss of appetite. Pt is crying and moaning in triage requesting pain medication.

## 2018-05-25 NOTE — ED Provider Notes (Signed)
[facility] DEPT Provider Note   CSN: [id number] Arrival date & time: 05/25/18  1529     History   Chief Complaint Chief Complaint  Patient presents with  . Abdominal Pain    HPI Kirsten Benson is a 47 y.o. female.  HPI Pt started having pain in her upper abdomen last night.  The pain has been increasing in severity.  The pain is severe.  She has been vomiting multiple times.  SHe has tried heating pads.  SHe is unable to eat or drink.  She has to curl up in a ball for any relief.  No prior abdominal surgery other than a tubal ligation. Pt did have some alcohol last night.  She is not sure if she had pancreatitis before.   Pt has had pain in the past years ago.  She has seen GI doctors and a workup but she has not had issues for years.  Past Medical History:  Diagnosis Date  . Anxiety   . Asthma    occ  . Cough   . Depression   . GERD (gastroesophageal reflux disease)   . Headache     Patient Active Problem List   Diagnosis Date Noted  . Cervical stenosis of spinal canal 04/23/2014    Past Surgical History:  Procedure Laterality Date  . ANTERIOR CERVICAL DECOMPRESSION/DISCECTOMY FUSION 4 LEVELS N/A 04/23/2014   Procedure: Cervical Three to Four, Cervical Four to Five, Cervical Five to Six, Cervical Six to Seven Anterior cervical decompression/diskectomy/fusion;  Surgeon: Floyce Stakes, MD;  Location: [facility];  Service: Neurosurgery;  Laterality: N/A;  C3-4 C4-5 C5-6 C6-7 Anterior cervical decompression/diskectomy/fusion  . TUBAL LIGATION       OB History   No obstetric history on file.      Home Medications   Wellbutrin, OTC ibuprofen, prior nausea meds   Family History History reviewed. No pertinent family history.  Social History Social History   Tobacco Use  . Smoking status: Current Every Day Smoker    Packs/day: 0.25    Years: 15.00    Pack years: 3.75    Types: Cigarettes  . Smokeless tobacco: Never  Used  Substance Use Topics  . Alcohol use: Yes    Comment: social  . Drug use: No     Allergies   Patient has no known allergies.   Review of Systems Review of Systems  All other systems reviewed and are negative.    Physical Exam Updated Vital Signs BP 100/70   Pulse (!) 59   Temp (!) 97.5 F (36.4 C) (Oral)   Resp 13   SpO2 97%   Physical Exam Vitals signs and nursing note reviewed.  Constitutional:      General: She is not in acute distress.    Appearance: She is well-developed.  HENT:     Head: Normocephalic and atraumatic.     Right Ear: External ear normal.     Left Ear: External ear normal.  Eyes:     General: No scleral icterus.       Right eye: No discharge.        Left eye: No discharge.     Conjunctiva/sclera: Conjunctivae normal.  Neck:     Musculoskeletal: Neck supple.     Trachea: No tracheal deviation.  Cardiovascular:     Rate and Rhythm: Normal rate and regular rhythm.  Pulmonary:     Effort: Pulmonary effort is normal. No respiratory distress.  Breath sounds: Normal breath sounds. No stridor. No wheezing or rales.  Abdominal:     General: Bowel sounds are normal. There is no distension.     Palpations: Abdomen is soft.     Tenderness: There is abdominal tenderness in the epigastric area. There is no guarding or rebound.  Musculoskeletal:        General: No tenderness.  Skin:    General: Skin is warm and dry.     Findings: No rash.  Neurological:     Mental Status: She is alert.     Cranial Nerves: No cranial nerve deficit (no facial droop, extraocular movements intact, no slurred speech).     Sensory: No sensory deficit.     Motor: No abnormal muscle tone or seizure activity.     Coordination: Coordination normal.      ED Treatments / Results  Labs (all labs ordered are listed, but only abnormal results are displayed) Labs Reviewed  COMPREHENSIVE METABOLIC PANEL - Abnormal; Notable for the following components:      Result  Value   Glucose, Bld 137 (*)    All other components within normal limits  CBC - Abnormal; Notable for the following components:   WBC 10.7 (*)    All other components within normal limits  URINALYSIS, ROUTINE W REFLEX MICROSCOPIC - Abnormal; Notable for the following components:   Glucose, UA 50 (*)    Hgb urine dipstick MODERATE (*)    Ketones, ur 80 (*)    Protein, ur 100 (*)    All other components within normal limits  LIPASE, BLOOD  I-STAT BETA HCG BLOOD, ED ([facility], [facility], [facility] ONLY)    EKG None  Radiology Ct Abdomen Pelvis W Contrast  Result Date: 05/25/2018 CLINICAL DATA:  47 year old female with epigastric pain beginning last night and progressive. Vomiting. EXAM: CT ABDOMEN AND PELVIS WITH CONTRAST TECHNIQUE: Multidetector CT imaging of the abdomen and pelvis was performed using the standard protocol following bolus administration of intravenous contrast. CONTRAST:  154mL ISOVUE-300 IOPAMIDOL (ISOVUE-300) INJECTION 61% COMPARISON:  Acute abdominal series 17 37 hours today. FINDINGS: Lower chest: Paraseptal emphysema appearing bulla at the left cardiophrenic angle, anterior basal segment of the left lower lobe (series 6, image 17). Otherwise negative lung bases. No pericardial or pleural effusion. Hepatobiliary: Scattered tiny round low-density areas in the liver are too small to characterize but most likely benign cysts. Negative gallbladder. Pancreas: Negative. Spleen: Negative. Adrenals/Urinary Tract: Normal adrenal glands. Bilateral renal enhancement and contrast excretion is symmetric and within normal limits. There are multiple small low-density areas in both kidneys compatible with benign cysts. The largest in the left lower pole is only 9 millimeters and has simple fluid density. No nephrolithiasis is evident. The proximal ureters are normal. There are few pelvic phleboliths, more so on the left. The urinary bladder is completely decompressed and unremarkable. Stomach/Bowel:  Decompressed descending and rectosigmoid colon with no convincing inflammation. Fairly decompressed appearance also of the transverse and right colon. The appendix is normal, retrocecal. But there is questionable mild wall thickening throughout the transverse colon as seen on coronal image 40. No mesenteric stranding. Negative terminal ileum. No dilated or abnormal small bowel loops. Negative stomach and duodenum. No free air, free fluid. Vascular/Lymphatic: Mild atherosclerosis. Major arterial structures in the abdomen and pelvis are patent. Portal venous system is patent. No lymphadenopathy. Reproductive: Negative. Other: No pelvic free fluid. Musculoskeletal: No acute osseous abnormality identified. IMPRESSION: 1. Questionable mild wall colonic wall thickening, most conspicuous in the  transverse colon, raising the possibility of mild colitis. 2. No other acute or inflammatory process identified in the abdomen or pelvis. 3. Paraseptal emphysema suspected in the left lower lobe. 4. Multiple tiny benign hepatic and renal cysts suspected. Electronically Signed   By: Genevie Ann M.D.   On: 05/25/2018 19:38   Dg Abdomen Acute W/chest  Result Date: 05/25/2018 CLINICAL DATA:  Epigastric pain since last night. EXAM: DG ABDOMEN ACUTE W/ 1V CHEST COMPARISON:  PA and lateral chest 04/21/2014. FINDINGS: Single-view of the chest demonstrates clear lungs and normal heart size. No pneumothorax or pleural fluid. Cervical fusion hardware noted. Two views of the abdomen show no free intraperitoneal air. The bowel gas pattern is normal. No abnormal abdominal calcification or focal bony abnormality. IMPRESSION: Negative exam. Electronically Signed   By: Inge Rise M.D.   On: 05/25/2018 17:44    Procedures Procedures (including critical care time)  Medications Ordered in ED Medications  iopamidol (ISOVUE-300) 61 % injection (has no administration in time range)  sodium chloride (PF) 0.9 % injection (has no  administration in time range)  HYDROmorphone (DILAUDID) injection 1 mg (1 mg Intravenous Given 05/25/18 1718)  ondansetron (ZOFRAN) injection 4 mg (4 mg Intravenous Given 05/25/18 1718)  iopamidol (ISOVUE-300) 61 % injection 100 mL (100 mLs Intravenous Contrast Given 05/25/18 1913)     Initial Impression / Assessment and Plan / ED Course  I have reviewed the triage vital signs and the nursing notes.  Pertinent labs & imaging results that were available during my care of the patient were reviewed by me and considered in my medical decision making (see chart for details).   Labs reviewed.  Urine did show hematuria.  White blood cell count slightly elevated.  LFTs and lipase otherwise normal.  CT scan showed evidence of possible mild colitis.  No other acute abnormality.  There was no evidence of kidney stones or nephrosis.  Patient symptoms improved with treatment.  At this time there does not appear to be any evidence of any acute abnormality that requires surgery or hospitalization.  Plan to discharge home with antibiotics for possible colitis.  Discussed outpatient follow-up with primary care doctor and GI.  Final Clinical Impressions(s) / ED Diagnoses   Final diagnoses:  Abdominal pain, unspecified abdominal location  Colitis    ED Discharge Orders         Ordered    ciprofloxacin (CIPRO) 500 MG tablet  2 times daily     05/25/18 2025    metroNIDAZOLE (FLAGYL) 500 MG tablet  3 times daily     05/25/18 2025    ondansetron (ZOFRAN ODT) 8 MG disintegrating tablet  Every 8 hours PRN     05/25/18 2025           Dorie Rank, MD 05/25/18 2028

## 2018-06-30 ENCOUNTER — Encounter: Payer: Self-pay | Admitting: Gastroenterology

## 2018-07-15 ENCOUNTER — Ambulatory Visit: Payer: BLUE CROSS/BLUE SHIELD | Admitting: Gastroenterology

## 2018-07-15 ENCOUNTER — Encounter: Payer: Self-pay | Admitting: Gastroenterology

## 2018-07-15 VITALS — BP 114/70 | HR 84 | Ht 62.25 in | Wt 134.0 lb

## 2018-07-15 DIAGNOSIS — R194 Change in bowel habit: Secondary | ICD-10-CM

## 2018-07-15 DIAGNOSIS — R933 Abnormal findings on diagnostic imaging of other parts of digestive tract: Secondary | ICD-10-CM

## 2018-07-15 DIAGNOSIS — R197 Diarrhea, unspecified: Secondary | ICD-10-CM | POA: Diagnosis not present

## 2018-07-15 DIAGNOSIS — R152 Fecal urgency: Secondary | ICD-10-CM

## 2018-07-15 DIAGNOSIS — R1084 Generalized abdominal pain: Secondary | ICD-10-CM | POA: Diagnosis not present

## 2018-07-15 MED ORDER — DICYCLOMINE HCL 10 MG PO CAPS: 10.0000 mg | ORAL_CAPSULE | Freq: Three times a day (TID) | ORAL | 11 refills | Status: DC

## 2018-07-15 MED ORDER — NA SULFATE-K SULFATE-MG SULF 17.5-3.13-1.6 GM/177ML PO SOLN: 1.0000 | Freq: Once | ORAL | 0 refills | Status: AC

## 2018-07-15 NOTE — Progress Notes (Signed)
History of Present Illness: This is a 48 year old female referred by Willeen Niece, PA-C for the evaluation of N/V/abdominal pain/abnormal CT of colon. [facility] ED evaluation on 05/25/2018 for severe abdominal pain, N/V showed hematuria and mildly elevated WBC. No diarrhea mentioned.  She was given Dilaudid and Zofran in the ED her symptoms substantially improved and she was discharged home.  She was treated with Cipro, Flagyl and Zofran.  She states she could not tolerate 1 of the antibiotics due to nausea so she stopped it and completed the other antibiotic.  She relates several episodes of acute severe generalized abdominal pain associated with nausea and vomiting since 2011 that led to the ED evaluations when she lived in [place] and a few locally.  Prior to the episode in December the last time it happened was about 2 years ago.  In between she is asymptomatic.  She relates that she has had looser stools since her episode in December associated with postprandial fecal urgency.  She has mild generalized abdominal discomfort intermittently.  Previously evaluated by Drs. Merrie Roof with [facility] in [facility]. EGD in 12/2013 for epigastric pain was normal.  She was prescribed dicyclomine 20 mg 3 times daily. Denies weight loss, constipation,change in stool caliber, melena, hematochezia, dysphagia, reflux symptoms, chest pain.    Abd/Pelvic CT 05/25/2018 IMPRESSION: 1. Questionable mild wall colonic wall thickening, most conspicuous in the transverse colon, raising the possibility of mild colitis. 2. No other acute or inflammatory process identified in the abdomen or pelvis. 3. Paraseptal emphysema suspected in the left lower lobe. 4. Multiple tiny benign hepatic and renal cysts suspected.   No Known Allergies Outpatient Medications Prior to Visit  Medication Sig Dispense Refill  . acetaminophen (TYLENOL) 325 MG tablet Take 650 mg by mouth every 6 (six) hours as needed for headache.    Marland Kitchen  buPROPion (WELLBUTRIN SR) 150 MG 12 hr tablet Take 150 mg by mouth 2 (two) times daily.    . ondansetron (ZOFRAN ODT) 8 MG disintegrating tablet Take 1 tablet (8 mg total) by mouth every 8 (eight) hours as needed for nausea or vomiting. 12 tablet 0  . ciprofloxacin (CIPRO) 500 MG tablet Take 1 tablet (500 mg total) by mouth 2 (two) times daily. 14 tablet 0  . cyclobenzaprine (FLEXERIL) 10 MG tablet Take 1 tablet (10 mg total) by mouth 2 (two) times daily as needed for muscle spasms. (Patient not taking: Reported on 10/03/2016) 20 tablet 0  . cyclobenzaprine (FLEXERIL) 10 MG tablet Take 1 tablet (10 mg total) by mouth 3 (three) times daily as needed for muscle spasms. (Patient not taking: Reported on 10/03/2016) 60 tablet 0  . diazepam (VALIUM) 5 MG tablet Take 1 tablet (5 mg total) by mouth every 6 (six) hours as needed for muscle spasms. (Patient not taking: Reported on 10/03/2016) 60 tablet 0  . famotidine (PEPCID) 20 MG tablet Take 1 tablet (20 mg total) by mouth 2 (two) times daily. (Patient not taking: Reported on 10/03/2016) 30 tablet 0  . HYDROcodone-acetaminophen (NORCO/VICODIN) 5-325 MG per tablet Take 1-2 tablets by mouth every 6 (six) hours as needed for pain. (Patient not taking: Reported on 04/21/2014) 20 tablet 0  . HYDROcodone-acetaminophen (NORCO/VICODIN) 5-325 MG per tablet Take 2 tablets by mouth every 4 (four) hours as needed for pain. (Patient not taking: Reported on 04/21/2014) 10 tablet 0  . ibuprofen (ADVIL,MOTRIN) 800 MG tablet Take 1 tablet (800 mg total) by mouth 3 (three) times daily. (Patient not  taking: Reported on 04/21/2014) 21 tablet 0  . metroNIDAZOLE (FLAGYL) 500 MG tablet Take 1 tablet (500 mg total) by mouth 3 (three) times daily. 21 tablet 0  . ondansetron (ZOFRAN) 4 MG tablet Take 1 tablet (4 mg total) by mouth every 6 (six) hours. (Patient not taking: Reported on 04/21/2014) 12 tablet 0   No facility-administered medications prior to visit.    Past Medical History:    Diagnosis Date  . Anxiety   . Asthma    occ  . Cough   . Depression   . GERD (gastroesophageal reflux disease)   . Headache    Past Surgical History:  Procedure Laterality Date  . ANTERIOR CERVICAL DECOMPRESSION/DISCECTOMY FUSION 4 LEVELS N/A 04/23/2014   Procedure: Cervical Three to Four, Cervical Four to Five, Cervical Five to Six, Cervical Six to Seven Anterior cervical decompression/diskectomy/fusion;  Surgeon: Floyce Stakes, MD;  Location: [facility];  Service: Neurosurgery;  Laterality: N/A;  C3-4 C4-5 C5-6 C6-7 Anterior cervical decompression/diskectomy/fusion  . TUBAL LIGATION     Social History   Socioeconomic History  . Marital status: Single    Spouse name: Not on file  . Number of children: 4  . Years of education: Not on file  . Highest education level: Not on file  Occupational History  . Occupation: [employer]  Social Needs  . Financial resource strain: Not on file  . Food insecurity:    Worry: Not on file    Inability: Not on file  . Transportation needs:    Medical: Not on file    Non-medical: Not on file  Tobacco Use  . Smoking status: Current Every Day Smoker    Packs/day: 0.25    Years: 15.00    Pack years: 3.75    Types: Cigarettes  . Smokeless tobacco: Never Used  Substance and Sexual Activity  . Alcohol use: Yes    Comment: social  . Drug use: No  . Sexual activity: Not on file  Lifestyle  . Physical activity:    Days per week: Not on file    Minutes per session: Not on file  . Stress: Not on file  Relationships  . Social connections:    Talks on phone: Not on file    Gets together: Not on file    Attends religious service: Not on file    Active member of club or organization: Not on file    Attends meetings of clubs or organizations: Not on file    Relationship status: Not on file  Other Topics Concern  . Not on file  Social History Narrative  . Not on file   Family History  Problem Relation Age of Onset  . Thyroid cancer Mother       Review of Systems: Pertinent positive and negative review of systems were noted in the above HPI section. All other review of systems were otherwise negative.    Physical Exam: General: Well developed, well nourished, no acute distress Head: Normocephalic and atraumatic Eyes:  sclerae anicteric, EOMI Ears: Normal auditory acuity Mouth: No deformity or lesions Neck: Supple, no masses or thyromegaly Lungs: Clear throughout to auscultation Heart: Regular rate and rhythm; no murmurs, rubs or bruits Abdomen: Soft, non tender and non distended. No masses, hepatosplenomegaly or hernias noted. Normal Bowel sounds Rectal: Deferred to colonoscopy Musculoskeletal: Symmetrical with no gross deformities  Skin: No lesions on visible extremities Pulses:  Normal pulses noted Extremities: No clubbing, cyanosis, edema or deformities noted Neurological: Alert oriented x  4, grossly nonfocal Cervical Nodes:  No significant cervical adenopathy Inguinal Nodes: No significant inguinal adenopathy Psychological:  Alert and cooperative. Normal mood and affect   Assessment and Recommendations:  1. Intermittent severe generalized abdominal pain associated with nausea and vomiting, etiology unclear.  Since December she has mild generalized abdominal pain with postprandial urgent looser stools. Abnormal CT with mild thickening of the colon in December, questionable significance.  Begin dicyclomine 10 mg po tid ac.  Possible IBS, abdominal migraine, acute intermittent porphyria, angioedema. R/O colorectal neoplasms and IBD although both appear unlikely.  Schedule colonoscopy. The risks (including bleeding, perforation, infection, missed lesions, medication reactions and possible hospitalization or surgery if complications occur), benefits, and alternatives to colonoscopy with possible biopsy and possible polypectomy were discussed with the patient and they consent to proceed.   2.  Hematuria.  Further  evaluation per PCP.   cc: Willeen Niece, PA-C

## 2018-07-15 NOTE — Patient Instructions (Signed)
We have sent the following medications to your pharmacy for you to pick up at your convenience: dicyclomine.   You have been scheduled for a colonoscopy. Please follow written instructions given to you at your visit today.  Please pick up your prep supplies at the pharmacy within the next 1-3 days. If you use inhalers (even only as needed), please bring them with you on the day of your procedure. Your physician has requested that you go to www.startemmi.com and enter the access code given to you at your visit today. This web site gives a general overview about your procedure. However, you should still follow specific instructions given to you by our office regarding your preparation for the procedure.  Thank you for choosing me and [facility].  Pricilla Riffle. Dagoberto Ligas., MD., Marval Regal

## 2018-07-17 ENCOUNTER — Encounter: Payer: Self-pay | Admitting: Gastroenterology

## 2018-07-20 ENCOUNTER — Emergency Department
Admission: EM | Admit: 2018-07-20 | Discharge: 2018-07-20 | Discharge status: Absent without leave | Payer: Self-pay | Source: Home / Self Care

## 2018-07-22 ENCOUNTER — Telehealth: Payer: Self-pay | Admitting: Gastroenterology

## 2018-07-22 NOTE — Telephone Encounter (Signed)
Hi Dr. Fuller Plan, this pt just cancelled her colonoscopy that was scheduled for tomorrow 2/12 with you because we are not in-network with her insurance and she was not aware of. Thank you.

## 2018-07-23 ENCOUNTER — Encounter: Payer: BLUE CROSS/BLUE SHIELD | Admitting: Gastroenterology

## 2019-03-30 ENCOUNTER — Ambulatory Visit
Admission: EM | Admit: 2019-03-30 | Discharge: 2019-03-30 | Disposition: A | Discharge status: Home / Self Care | Payer: BLUE CROSS/BLUE SHIELD | Attending: Emergency Medicine | Admitting: Emergency Medicine

## 2019-03-30 ENCOUNTER — Other Ambulatory Visit: Payer: Self-pay

## 2019-03-30 DIAGNOSIS — S61211A Laceration without foreign body of left index finger without damage to nail, initial encounter: Secondary | ICD-10-CM

## 2019-03-30 DIAGNOSIS — W540XXA Bitten by dog, initial encounter: Secondary | ICD-10-CM | POA: Diagnosis not present

## 2019-03-30 MED ORDER — AMOXICILLIN-POT CLAVULANATE 875-125 MG PO TABS: 1.0000 | ORAL_TABLET | Freq: Two times a day (BID) | ORAL | 0 refills | Status: DC

## 2019-03-30 NOTE — ED Triage Notes (Signed)
Pt c/o her dog bite her lt 2nd digit last night. States her dogs rabies shots are up to date

## 2019-03-30 NOTE — Discharge Instructions (Signed)
Keep area clean and dry. May change dressing once daily using nonadherent gauze, Coban. Take antibiotic with breakfast and dinner as prescribed. Return in 7 days for suture removal, or sooner if you develop worsening pain, redness, purulent discharge, fever.

## 2019-03-30 NOTE — ED Provider Notes (Signed)
[facility]    CSN: [id number] Arrival date & time: 03/30/19  1442      History   Chief Complaint Chief Complaint  Patient presents with  . Animal Bite    HPI Kirsten Benson is a 48 y.o. female presenting for left index finger laceration.  Patient states she was bit accidentally by her dog, who is up-to-date on rabies shots.  Patient is able to irrigate the wound, apply Neosporin, keep wound covered.  Patient denies prolonged bleeding, anticoagulant use.  Patient resting pain, though has been taking Tylenol, ibuprofen with adequate relief.  Past Medical History:  Diagnosis Date  . Anxiety   . Asthma    occ  . Cough   . Depression   . GERD (gastroesophageal reflux disease)   . Headache     Patient Active Problem List   Diagnosis Date Noted  . Cervical stenosis of spinal canal 04/23/2014    Past Surgical History:  Procedure Laterality Date  . ANTERIOR CERVICAL DECOMPRESSION/DISCECTOMY FUSION 4 LEVELS N/A 04/23/2014   Procedure: Cervical Three to Four, Cervical Four to Five, Cervical Five to Six, Cervical Six to Seven Anterior cervical decompression/diskectomy/fusion;  Surgeon: Floyce Stakes, MD;  Location: [facility];  Service: Neurosurgery;  Laterality: N/A;  C3-4 C4-5 C5-6 C6-7 Anterior cervical decompression/diskectomy/fusion  . TUBAL LIGATION      OB History   No obstetric history on file.      Home Medications    Prior to Admission medications   Medication Sig Start Date End Date Taking? Authorizing Provider  acetaminophen (TYLENOL) 325 MG tablet Take 650 mg by mouth every 6 (six) hours as needed for headache.    [provider]  amoxicillin-clavulanate (AUGMENTIN) 875-125 MG tablet Take 1 tablet by mouth every 12 (twelve) hours. 03/30/19   Hall-Potvin, [place], PA-C  buPROPion (WELLBUTRIN SR) 150 MG 12 hr tablet Take 150 mg by mouth 2 (two) times daily. 04/25/18   [provider]  ondansetron (ZOFRAN ODT) 8 MG  disintegrating tablet Take 1 tablet (8 mg total) by mouth every 8 (eight) hours as needed for nausea or vomiting. 05/25/18   Dorie Rank, MD    Family History Family History  Problem Relation Age of Onset  . Thyroid cancer Mother     Social History Social History   Tobacco Use  . Smoking status: Current Every Day Smoker    Packs/day: 0.25    Years: 15.00    Pack years: 3.75    Types: Cigarettes  . Smokeless tobacco: Never Used  Substance Use Topics  . Alcohol use: Yes    Comment: social  . Drug use: No     Allergies   Patient has no known allergies.   Review of Systems Review of Systems  Constitutional: Negative for fatigue and fever.  HENT: Negative for ear pain, sinus pain, sore throat and voice change.   Eyes: Negative for pain, redness and visual disturbance.  Respiratory: Negative for cough and shortness of breath.   Cardiovascular: Negative for chest pain and palpitations.  Gastrointestinal: Negative for abdominal pain, diarrhea and vomiting.  Musculoskeletal: Negative for arthralgias and myalgias.  Skin: Positive for wound. Negative for rash.  Neurological: Negative for syncope and headaches.     Physical Exam Triage Vital Signs ED Triage Vitals  Enc Vitals Group     BP 03/30/19 1457 131/81     Pulse Rate 03/30/19 1457 86     Resp 03/30/19 1457 18  Temp 03/30/19 1457 99.4 F (37.4 C)     Temp Source 03/30/19 1457 Oral     SpO2 03/30/19 1457 98 %     Weight --      Height --      Head Circumference --      Peak Flow --      Pain Score 03/30/19 1458 8     Pain Loc --      Pain Edu? --      Excl. in [place]? --    No data found.  Updated Vital Signs BP 131/81 (BP Location: Left Arm)   Pulse 86   Temp 99.4 F (37.4 C) (Oral)   Resp 18   LMP 03/29/2019   SpO2 98%   Visual Acuity Right Eye Distance:   Left Eye Distance:   Bilateral Distance:    Right Eye Near:   Left Eye Near:    Bilateral Near:     Physical Exam Constitutional:       General: She is not in acute distress. HENT:     Head: Normocephalic and atraumatic.  Eyes:     General: No scleral icterus.    Pupils: Pupils are equal, round, and reactive to light.  Cardiovascular:     Rate and Rhythm: Normal rate.     Pulses: Normal pulses.  Pulmonary:     Effort: Pulmonary effort is normal.  Musculoskeletal:     Comments: Patient has mildly decreased flexion secondary to pain/swelling.  Station intact.  Skin:    General: Skin is warm.     Capillary Refill: Capillary refill takes less than 2 seconds.     Coloration: Skin is not jaundiced or pale.     Comments: 2 cm jagged laceration extending to adipose over PIP of second digit, left hand.  No foreign body identified.  Neurological:     General: No focal deficit present.     Mental Status: She is alert and oriented to person, place, and time.      UC Treatments / Results  Labs (all labs ordered are listed, but only abnormal results are displayed) Labs Reviewed - No data to display  EKG   Radiology No results found.  Procedures Laceration Repair  Date/Time: 03/30/2019 4:24 PM Performed by: Quincy Sheehan, PA-C Authorized by: Quincy Sheehan, PA-C   Consent:    Consent obtained:  Verbal   Consent given by:  Patient   Risks discussed:  Infection, need for additional repair, pain, poor cosmetic result and poor wound healing   Alternatives discussed:  No treatment and delayed treatment Universal protocol:    Patient identity confirmed:  Verbally with patient Anesthesia (see MAR for exact dosages):    Anesthesia method:  Nerve block   Block location:  2nd digit   Block needle gauge:  25 G   Block anesthetic:  Lidocaine 2% w/o epi   Block technique:  Digital   Block injection procedure:  Anatomic landmarks identified, anatomic landmarks palpated, introduced needle, negative aspiration for blood and incremental injection   Block outcome:  Anesthesia achieved Laceration details:     Location:  Finger   Finger location:  L index finger   Length (cm):  2   Depth (mm):  5 Repair type:    Repair type:  Intermediate Pre-procedure details:    Preparation:  Patient was prepped and draped in usual sterile fashion Exploration:    Hemostasis achieved with:  Direct pressure   Wound exploration: wound explored through full range  of motion     Contaminated: no   Treatment:    Area cleansed with:  Saline and Hibiclens   Amount of cleaning:  Standard   Irrigation solution:  Tap water   Irrigation volume:  1 minute   Irrigation method:  Tap Skin repair:    Repair method:  Sutures   Suture size:  3-0   Suture material:  Prolene   Suture technique:  Simple interrupted and horizontal mattress   Number of sutures:  7 Approximation:    Approximation:  Close Post-procedure details:    Dressing:  Sterile dressing   Patient tolerance of procedure:  Tolerated well, no immediate complications   (including critical care time)  Medications Ordered in UC Medications - No data to display  Initial Impression / Assessment and Plan / UC Course  I have reviewed the triage vital signs and the nursing notes.  Pertinent labs & imaging results that were available during my care of the patient were reviewed by me and considered in my medical decision making (see chart for details).     7 sutures placed in office (6 simple interrupted, 1 horizontal mattress) which patient tolerated well.  Given mechanism of injury (diabetic), poor patient hygiene, will initiate Augmentin today.  Patient to return in 7 days for suture removal.  Return precautions discussed, patient verbalized understanding and is agreeable to plan. Final Clinical Impressions(s) / UC Diagnoses   Final diagnoses:  Laceration of left index finger without foreign body without damage to nail, initial encounter     Discharge Instructions     Keep area clean and dry. May change dressing once daily using nonadherent gauze,  [place]. Take antibiotic with breakfast and dinner as prescribed. Return in 7 days for suture removal, or sooner if you develop worsening pain, redness, purulent discharge, fever.    ED Prescriptions    Medication Sig Dispense Auth. Provider   amoxicillin-clavulanate (AUGMENTIN) 875-125 MG tablet Take 1 tablet by mouth every 12 (twelve) hours. 14 tablet Hall-Potvin, [place], PA-C     PDMP not reviewed this encounter.   Hall-Potvin, [place], [place] 03/30/19 1655

## 2019-04-07 ENCOUNTER — Ambulatory Visit
Admission: EM | Admit: 2019-04-07 | Discharge: 2019-04-07 | Disposition: A | Discharge status: Home / Self Care | Payer: BLUE CROSS/BLUE SHIELD

## 2019-04-07 DIAGNOSIS — Z4802 Encounter for removal of sutures: Secondary | ICD-10-CM

## 2019-04-07 NOTE — ED Triage Notes (Signed)
7 sutures removed, band aid applied. No redness or drainage noted

## 2019-04-15 ENCOUNTER — Other Ambulatory Visit: Payer: Self-pay

## 2019-04-15 DIAGNOSIS — Z20822 Contact with and (suspected) exposure to covid-19: Secondary | ICD-10-CM

## 2019-04-16 LAB — NOVEL CORONAVIRUS, NAA: SARS-CoV-2, NAA: NOT DETECTED

## 2019-06-30 ENCOUNTER — Other Ambulatory Visit: Payer: Self-pay | Admitting: Family Medicine

## 2019-06-30 DIAGNOSIS — Z1231 Encounter for screening mammogram for malignant neoplasm of breast: Secondary | ICD-10-CM

## 2019-07-07 ENCOUNTER — Telehealth: Payer: Self-pay | Admitting: Gastroenterology

## 2019-07-07 NOTE — Telephone Encounter (Signed)
Patient request to switch care back here.  Due for a colonoscopy, last saw you 02//2020.  Switched to Dr Earlean Shawl due to having [employer]	[place].  She is requesting to switch back to you.  Records are in Epic I have them printed and sending to you for review.  Please advise if you will take this patient back on.

## 2019-07-07 NOTE — Telephone Encounter (Signed)
OK to return to [facility]. Please schedule an office visit.

## 2019-07-13 NOTE — Telephone Encounter (Signed)
Left message for pt to call to schedule OV

## 2019-08-05 ENCOUNTER — Ambulatory Visit
Admission: RE | Admit: 2019-08-05 | Discharge: 2019-08-05 | Disposition: A | Discharge status: Home / Self Care | Payer: 59 | Source: Ambulatory Visit | Attending: Family Medicine | Admitting: Family Medicine

## 2019-08-05 ENCOUNTER — Other Ambulatory Visit: Payer: Self-pay

## 2019-08-05 DIAGNOSIS — Z1231 Encounter for screening mammogram for malignant neoplasm of breast: Secondary | ICD-10-CM

## 2019-08-07 ENCOUNTER — Other Ambulatory Visit: Payer: Self-pay | Admitting: Family Medicine

## 2019-08-07 DIAGNOSIS — R928 Other abnormal and inconclusive findings on diagnostic imaging of breast: Secondary | ICD-10-CM

## 2019-08-25 ENCOUNTER — Other Ambulatory Visit: Payer: Self-pay

## 2019-08-25 ENCOUNTER — Other Ambulatory Visit: Payer: Self-pay | Admitting: Family Medicine

## 2019-08-25 ENCOUNTER — Ambulatory Visit
Admission: RE | Admit: 2019-08-25 | Discharge: 2019-08-25 | Disposition: A | Discharge status: Home / Self Care | Payer: 59 | Source: Ambulatory Visit | Attending: Family Medicine | Admitting: Family Medicine

## 2019-08-25 DIAGNOSIS — R928 Other abnormal and inconclusive findings on diagnostic imaging of breast: Secondary | ICD-10-CM

## 2019-08-25 DIAGNOSIS — R921 Mammographic calcification found on diagnostic imaging of breast: Secondary | ICD-10-CM

## 2020-04-01 ENCOUNTER — Other Ambulatory Visit: Payer: Self-pay | Admitting: Family Medicine

## 2020-04-01 DIAGNOSIS — R921 Mammographic calcification found on diagnostic imaging of breast: Secondary | ICD-10-CM

## 2020-04-21 ENCOUNTER — Other Ambulatory Visit: Payer: Self-pay

## 2020-04-21 ENCOUNTER — Ambulatory Visit
Admission: RE | Admit: 2020-04-21 | Discharge: 2020-04-21 | Disposition: A | Discharge status: Home / Self Care | Payer: 59 | Source: Ambulatory Visit | Attending: Family Medicine | Admitting: Family Medicine

## 2020-04-21 DIAGNOSIS — R921 Mammographic calcification found on diagnostic imaging of breast: Secondary | ICD-10-CM

## 2020-08-28 ENCOUNTER — Other Ambulatory Visit: Payer: Self-pay

## 2020-08-28 ENCOUNTER — Emergency Department (HOSPITAL_COMMUNITY): Payer: BC Managed Care – PPO

## 2020-08-28 ENCOUNTER — Encounter (HOSPITAL_COMMUNITY): Payer: Self-pay | Admitting: Emergency Medicine

## 2020-08-28 ENCOUNTER — Emergency Department (HOSPITAL_COMMUNITY)
Admission: EM | Admit: 2020-08-28 | Discharge: 2020-08-28 | Disposition: A | Discharge status: Home / Self Care | Payer: BC Managed Care – PPO | Attending: Emergency Medicine | Admitting: Emergency Medicine

## 2020-08-28 DIAGNOSIS — R1032 Left lower quadrant pain: Secondary | ICD-10-CM | POA: Insufficient documentation

## 2020-08-28 DIAGNOSIS — J45909 Unspecified asthma, uncomplicated: Secondary | ICD-10-CM | POA: Diagnosis not present

## 2020-08-28 DIAGNOSIS — F17210 Nicotine dependence, cigarettes, uncomplicated: Secondary | ICD-10-CM | POA: Insufficient documentation

## 2020-08-28 DIAGNOSIS — K529 Noninfective gastroenteritis and colitis, unspecified: Secondary | ICD-10-CM

## 2020-08-28 DIAGNOSIS — R1030 Lower abdominal pain, unspecified: Secondary | ICD-10-CM | POA: Diagnosis present

## 2020-08-28 LAB — URINALYSIS, ROUTINE W REFLEX MICROSCOPIC
Bacteria, UA: NONE SEEN
Bilirubin Urine: NEGATIVE
Glucose, UA: NEGATIVE mg/dL
Ketones, ur: NEGATIVE mg/dL
Leukocytes,Ua: NEGATIVE
Nitrite: NEGATIVE
Protein, ur: 100 mg/dL — AB
RBC / HPF: >50 RBC/hpf — ABNORMAL HIGH (ref 0–5)
Specific Gravity, Urine: 1.033 — ABNORMAL HIGH (ref 1.005–1.030)
pH: 5 (ref 5.0–8.0)

## 2020-08-28 LAB — COMPREHENSIVE METABOLIC PANEL
ALT: 10 U/L (ref 0–44)
AST: 17 U/L (ref 15–41)
Albumin: 4 g/dL (ref 3.5–5.0)
Alkaline Phosphatase: 40 U/L (ref 38–126)
Anion gap: 9 (ref 5–15)
BUN: 14 mg/dL (ref 6–20)
CO2: 23 mmol/L (ref 22–32)
Calcium: 9.2 mg/dL (ref 8.9–10.3)
Chloride: 108 mmol/L (ref 98–111)
Creatinine, Ser: 0.92 mg/dL (ref 0.44–1.00)
GFR, Estimated: >60 mL/min (ref >60)
Glucose, Bld: 103 mg/dL — ABNORMAL HIGH (ref 70–99)
Potassium: 3.6 mmol/L (ref 3.5–5.1)
Sodium: 140 mmol/L (ref 135–145)
Total Bilirubin: 0.6 mg/dL (ref 0.3–1.2)
Total Protein: 6.8 g/dL (ref 6.5–8.1)

## 2020-08-28 LAB — CBC
HCT: 37.3 % (ref 36.0–46.0)
Hemoglobin: 12.1 g/dL (ref 12.0–15.0)
MCH: 28.7 pg (ref 26.0–34.0)
MCHC: 32.4 g/dL (ref 30.0–36.0)
MCV: 88.4 fL (ref 80.0–100.0)
Platelets: 239 10*3/uL (ref 150–400)
RBC: 4.22 MIL/uL (ref 3.87–5.11)
RDW: 13.7 % (ref 11.5–15.5)
WBC: 9.4 10*3/uL (ref 4.0–10.5)
nRBC: 0 % (ref 0.0–0.2)

## 2020-08-28 LAB — I-STAT BETA HCG BLOOD, ED (MC, WL, AP ONLY): I-stat hCG, quantitative: <5 m[IU]/mL (ref <5)

## 2020-08-28 LAB — LIPASE, BLOOD: Lipase: 35 U/L (ref 11–51)

## 2020-08-28 MED ORDER — FENTANYL CITRATE (PF) 100 MCG/2ML IJ SOLN
50.0000 ug | Freq: Once | INTRAMUSCULAR | Status: AC
  Administered 2020-08-28: 50 ug via INTRAVENOUS
  Filled 2020-08-28: qty 2

## 2020-08-28 MED ORDER — ONDANSETRON HCL 4 MG/2ML IJ SOLN
4.0000 mg | Freq: Once | INTRAMUSCULAR | Status: AC
  Administered 2020-08-28: 4 mg via INTRAVENOUS
  Filled 2020-08-28: qty 2

## 2020-08-28 MED ORDER — IOHEXOL 300 MG/ML  SOLN
100.0000 mL | Freq: Once | INTRAMUSCULAR | Status: AC | PRN
  Administered 2020-08-28: 100 mL via INTRAVENOUS

## 2020-08-28 MED ORDER — ONDANSETRON 4 MG PO TBDP: ORAL_TABLET | ORAL | 0 refills | Status: AC

## 2020-08-28 MED ORDER — DICYCLOMINE HCL 20 MG PO TABS: 20.0000 mg | ORAL_TABLET | Freq: Two times a day (BID) | ORAL | 0 refills | Status: DC

## 2020-08-28 MED ORDER — SODIUM CHLORIDE 0.9 % IV BOLUS
1000.0000 mL | Freq: Once | INTRAVENOUS | Status: AC
  Administered 2020-08-28: 1000 mL via INTRAVENOUS

## 2020-08-28 NOTE — Discharge Instructions (Addendum)
You likely have inflammatory bowel disease   Take Zofran for nausea.  Take Bentyl for cramps  Please see Dr. Silvio Pate and you may need a colonoscopy  Return to ER if you have worse abdominal pain, vomiting, fever.

## 2020-08-28 NOTE — ED Provider Notes (Signed)
[facility] DEPT Provider Note   CSN: [id number] Arrival date & time: 08/28/20  0354     History Chief Complaint  Patient presents with  . Abdominal Pain    Kirsten Benson is a 50 y.o. female hx of asthma, colitis here presenting with diffuse lower abdominal pain.  Patient states that she was playing cards and she has diffuse lower abdominal cramping.  She felt like she was going to have a bowel movement but did not.  She states that this has happened before she was diagnosed with colitis.  She saw GI here and referred to GI at [facility].  She denies eating any bad food or fevers.  Denies any sick contacts.  Denies any urinary symptoms  The history is provided by the patient.       Past Medical History:  Diagnosis Date  . Anxiety   . Asthma    occ  . Cough   . Depression   . GERD (gastroesophageal reflux disease)   . Headache     Patient Active Problem List   Diagnosis Date Noted  . Cervical stenosis of spinal canal 04/23/2014    Past Surgical History:  Procedure Laterality Date  . ANTERIOR CERVICAL DECOMPRESSION/DISCECTOMY FUSION 4 LEVELS N/A 04/23/2014   Procedure: Cervical Three to Four, Cervical Four to Five, Cervical Five to Six, Cervical Six to Seven Anterior cervical decompression/diskectomy/fusion;  Surgeon: Floyce Stakes, MD;  Location: [facility];  Service: Neurosurgery;  Laterality: N/A;  C3-4 C4-5 C5-6 C6-7 Anterior cervical decompression/diskectomy/fusion  . TUBAL LIGATION       OB History   No obstetric history on file.     Family History  Problem Relation Age of Onset  . Thyroid cancer Mother     Social History   Tobacco Use  . Smoking status: Current Every Day Smoker    Packs/day: 0.25    Years: 15.00    Pack years: 3.75    Types: Cigarettes  . Smokeless tobacco: Never Used  Substance Use Topics  . Alcohol use: Yes    Comment: social  . Drug use: No    Home Medications Prior to  Admission medications   Medication Sig Start Date End Date Taking? Authorizing Provider  acetaminophen (TYLENOL) 325 MG tablet Take 650 mg by mouth every 6 (six) hours as needed for headache.    [provider]  amoxicillin-clavulanate (AUGMENTIN) 875-125 MG tablet Take 1 tablet by mouth every 12 (twelve) hours. 03/30/19   Hall-Potvin, [place], PA-C  buPROPion (WELLBUTRIN SR) 150 MG 12 hr tablet Take 150 mg by mouth 2 (two) times daily. 04/25/18   [provider]  ondansetron (ZOFRAN ODT) 8 MG disintegrating tablet Take 1 tablet (8 mg total) by mouth every 8 (eight) hours as needed for nausea or vomiting. 05/25/18   Dorie Rank, MD    Allergies    Patient has no known allergies.  Review of Systems   Review of Systems  Gastrointestinal: Positive for abdominal pain.  All other systems reviewed and are negative.   Physical Exam Updated Vital Signs BP 105/66 (BP Location: Left Arm)   Pulse 69   Temp 97.8 F (36.6 C) (Oral)   Resp (!) 22   SpO2 100%   Physical Exam Vitals and nursing note reviewed.  Constitutional:      Comments: Uncomfortable, crying in pain  HENT:     Head: Normocephalic.     Mouth/Throat:     Mouth: Mucous membranes are  moist.  Eyes:     Extraocular Movements: Extraocular movements intact.  Cardiovascular:     Rate and Rhythm: Normal rate and regular rhythm.     Heart sounds: Normal heart sounds.  Pulmonary:     Effort: Pulmonary effort is normal.     Breath sounds: Normal breath sounds.  Abdominal:     General: Abdomen is flat.     Comments: Mild diffuse lower abdominal tenderness and worse in the left lower quadrant  Skin:    General: Skin is warm.     Capillary Refill: Capillary refill takes less than 2 seconds.  Neurological:     General: No focal deficit present.     Mental Status: She is oriented to person, place, and time.  Psychiatric:        Mood and Affect: Mood normal.        Behavior: Behavior normal.     ED Results  / Procedures / Treatments   Labs (all labs ordered are listed, but only abnormal results are displayed) Labs Reviewed  CBC  COMPREHENSIVE METABOLIC PANEL  LIPASE, BLOOD  I-STAT BETA HCG BLOOD, ED ([facility], [facility], [facility] ONLY)    EKG None  Radiology No results found.  Procedures Procedures   Medications Ordered in ED Medications  sodium chloride 0.9 % bolus 1,000 mL (1,000 mLs Intravenous Bolus from Bag 08/28/20 0446)  ondansetron (ZOFRAN) injection 4 mg (4 mg Intravenous Given 08/28/20 0444)  fentaNYL (SUBLIMAZE) injection 50 mcg (50 mcg Intravenous Given 08/28/20 0444)    ED Course  I have reviewed the triage vital signs and the nursing notes.  Pertinent labs & imaging results that were available during my care of the patient were reviewed by me and considered in my medical decision making (see chart for details).    MDM Rules/Calculators/A&P                         Kirsten Benson is a 50 y.o. female here presenting with abdominal pain.  I think likely renal colic versus diverticulitis versus colitis versus viral gastro.  Will get CBC, CMP, urinalysis, CT abdomen pelvis.  Will give IV fluids and Zofran and reassess  6:35 AM CT showed possible mild colitis.  White blood cell count is normal.  She states that she has multiple CTs before that showed colitis.  She states that this is a recurrent problem I suspect IBD.  Since she has no fever or white count, will hold off on antibiotics.  She has seen Dr. Silvio Pate before and she was supposed to get a colonoscopy but her insurance changed.  She states that she has insurance now. We will give her Zofran and Bentyl and have her follow-up with Dr. Silvio Pate for colonoscopy.    Final Clinical Impression(s) / ED Diagnoses Final diagnoses:  None    Rx / DC Orders ED Discharge Orders    None       Drenda Freeze, MD 08/28/20 [phone]

## 2020-08-28 NOTE — ED Triage Notes (Signed)
Patient arrives with LLQ abdominal pain that started last night. Patient noted to be tearful and restless. Patient states she was evaluated previously for the pain, but is unable to tell this writer what her diagnosis was. Patient endorses nausea.

## 2020-08-28 NOTE — ED Notes (Signed)
Patient transported to CT 

## 2020-09-16 ENCOUNTER — Ambulatory Visit
Admission: EM | Admit: 2020-09-16 | Discharge: 2020-09-16 | Disposition: A | Discharge status: Home / Self Care | Payer: BC Managed Care – PPO | Attending: Emergency Medicine | Admitting: Emergency Medicine

## 2020-09-16 ENCOUNTER — Ambulatory Visit (INDEPENDENT_AMBULATORY_CARE_PROVIDER_SITE_OTHER): Payer: BC Managed Care – PPO

## 2020-09-16 ENCOUNTER — Other Ambulatory Visit: Payer: Self-pay

## 2020-09-16 DIAGNOSIS — R079 Chest pain, unspecified: Secondary | ICD-10-CM

## 2020-09-16 DIAGNOSIS — R0781 Pleurodynia: Secondary | ICD-10-CM

## 2020-09-16 DIAGNOSIS — M25561 Pain in right knee: Secondary | ICD-10-CM

## 2020-09-16 DIAGNOSIS — M25562 Pain in left knee: Secondary | ICD-10-CM

## 2020-09-16 DIAGNOSIS — R0602 Shortness of breath: Secondary | ICD-10-CM

## 2020-09-16 MED ORDER — METHOCARBAMOL 500 MG PO TABS: 500.0000 mg | ORAL_TABLET | Freq: Two times a day (BID) | ORAL | 0 refills | Status: AC | PRN

## 2020-09-16 MED ORDER — IBUPROFEN 600 MG PO TABS: 600.0000 mg | ORAL_TABLET | Freq: Four times a day (QID) | ORAL | 0 refills | Status: AC | PRN

## 2020-09-16 NOTE — Discharge Instructions (Addendum)
Go to the emergency department if you have acute shortness of breath or other concerning symptoms.  Take ibuprofen as needed for discomfort.  Take the muscle relaxer as needed for muscle spasm; Do not drive, operate machinery, or drink alcohol with this medication as it can cause drowsiness.   Follow up with your primary care provider or an orthopedist if your symptoms are not improving.
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
## 2020-09-22 ENCOUNTER — Encounter: Payer: Self-pay | Admitting: Gastroenterology

## 2020-09-22 ENCOUNTER — Other Ambulatory Visit: Payer: BC Managed Care – PPO

## 2020-09-22 ENCOUNTER — Ambulatory Visit: Payer: BC Managed Care – PPO | Admitting: Gastroenterology

## 2020-09-22 VITALS — BP 104/70 | HR 96 | Ht 62.5 in | Wt 136.5 lb

## 2020-09-22 DIAGNOSIS — R933 Abnormal findings on diagnostic imaging of other parts of digestive tract: Secondary | ICD-10-CM

## 2020-09-22 DIAGNOSIS — R1030 Lower abdominal pain, unspecified: Secondary | ICD-10-CM

## 2020-09-22 DIAGNOSIS — R197 Diarrhea, unspecified: Secondary | ICD-10-CM

## 2020-09-22 MED ORDER — NA SULFATE-K SULFATE-MG SULF 17.5-3.13-1.6 GM/177ML PO SOLN: 1.0000 | Freq: Once | ORAL | 0 refills | Status: AC

## 2020-09-22 MED ORDER — GLYCOPYRROLATE 2 MG PO TABS: 2.0000 mg | ORAL_TABLET | Freq: Two times a day (BID) | ORAL | 11 refills | Status: AC

## 2020-09-22 NOTE — Progress Notes (Addendum)
    History of Present Illness: This is a 50 year old female referred by Shirlyn Goltz, MD for the evaluation of intermittent abdominal pain associated with nausea and vomiting and an abnormal CT scan of the colon.  See office note from February 2020.  She was previously evaluated by [facility] in 2015.  EGD performed in July 2015 was normal.  Subsequent to her visit with me in February 2020 she was again evaluated by [facility].  Colonoscopy and EGD were scheduled in January 2021 and she canceled d/t insurance coverage.  She was evaluated in [facility] ED on August 28, 2020 for crampy lower abdominal pain.  CT AP on August 28, 2020 showed questionable mild colonic wall thickening in the transverse and descending colon although this was felt possibly due to underdistention.  Multiple hepatic and renal cysts were noted similar to prior exams.  No other acute abnormality in the abdomen or pelvis. CMP, CBC, lipase were all normal.  She relates she generally has between 2 and 4 loose stools per day for the past 2 years.  Her diarrhea does often worsen around the time of abdominal pain.  She cannot clearly link any particular food or beverage to her diarrhea.  Review of Systems: Pertinent positive and negative review of systems were noted in the above HPI section. All other review of systems were otherwise negative.  Current Medications, Allergies, Past Medical History, Past Surgical History, Family History and Social History were reviewed in [employer] record.   Physical Exam: General: Well developed, well nourished, no acute distress Head: Normocephalic and atraumatic Eyes: Sclerae anicteric, EOMI Ears: Normal auditory acuity Mouth: Not examined, mask on during Covid-19 pandemic Neck: Supple, no masses or thyromegaly Lungs: Clear throughout to auscultation Heart: Regular rate and rhythm; no murmurs, rubs or bruits Abdomen: Soft, non tender and non distended. No  masses, hepatosplenomegaly or hernias noted. Normal Bowel sounds Rectal: Deferred to colonoscopy  Musculoskeletal: Symmetrical with no gross deformities  Skin: No lesions on visible extremities Pulses:  Normal pulses noted Extremities: No clubbing, cyanosis, edema or deformities noted Neurological: Alert oriented x 4, grossly nonfocal Cervical Nodes:  No significant cervical adenopathy Inguinal Nodes: No significant inguinal adenopathy Psychological:  Alert and cooperative. Normal mood and affect   Assessment and Recommendations:  1.  Chronic diarrhea, intermittent lower abdominal pain associated with with N/V, abnormal CT of the colon.  Nausea and vomiting occur when she has severe abdominal pain but not with mild abdominal pain.  Suspected IBS-D.  Rule out IBD. Check tTG and IgA today.  Trial of a low FODMAP diet.  Begin glycopyrrolate 2 mg p.o. twice daily as needed abdominal pain and cramping.  Imodium twice daily as needed.  Schedule colonoscopy. The risks (including bleeding, perforation, infection, missed lesions, medication reactions and possible hospitalization or surgery if complications occur), benefits, and alternatives to colonoscopy with possible biopsy and possible polypectomy were discussed with the patient and they consent to proceed.     cc: Shirlyn Goltz, MD

## 2020-09-22 NOTE — Patient Instructions (Addendum)
Your provider has requested that you go to the basement level for lab work before leaving today. Press "B" on the elevator. The lab is located at the first door on the left as you exit the elevator.  We have sent the following medications to your pharmacy for you to pick up at your convenience: glycopyrrolate.   You can take over the counter Imodium twice daily for diarrhea.   You have been given a low-fodmap diet to follow.   You have been scheduled for a colonoscopy. Please follow written instructions given to you at your visit today.  Please pick up your prep supplies at the pharmacy within the next 1-3 days. If you use inhalers (even only as needed), please bring them with you on the day of your procedure.  Normal BMI (Body Mass Index- based on height and weight) is between 19 and 25. Your BMI today is Body mass index is 24.57 kg/m. Marland Kitchen Please consider follow up  regarding your BMI with your Primary Care Provider.  Thank you for choosing me and [facility].  Pricilla Riffle. Dagoberto Ligas., MD., Marval Regal

## 2020-09-23 LAB — IGA: Immunoglobulin A: 102 mg/dL (ref 47–310)

## 2020-09-23 LAB — TISSUE TRANSGLUTAMINASE, IGA: (tTG) Ab, IgA: <1 U/mL

## 2020-09-30 ENCOUNTER — Encounter: Payer: Self-pay | Admitting: Gastroenterology

## 2020-09-30 ENCOUNTER — Ambulatory Visit (AMBULATORY_SURGERY_CENTER): Payer: BC Managed Care – PPO | Admitting: Gastroenterology

## 2020-09-30 ENCOUNTER — Other Ambulatory Visit: Payer: Self-pay | Admitting: Gastroenterology

## 2020-09-30 ENCOUNTER — Other Ambulatory Visit: Payer: Self-pay

## 2020-09-30 VITALS — BP 110/64 | HR 61 | Temp 98.4°F | Resp 17 | Ht 62.0 in | Wt 136.0 lb

## 2020-09-30 DIAGNOSIS — R933 Abnormal findings on diagnostic imaging of other parts of digestive tract: Secondary | ICD-10-CM

## 2020-09-30 DIAGNOSIS — R197 Diarrhea, unspecified: Secondary | ICD-10-CM

## 2020-09-30 DIAGNOSIS — R1030 Lower abdominal pain, unspecified: Secondary | ICD-10-CM

## 2020-09-30 DIAGNOSIS — D124 Benign neoplasm of descending colon: Secondary | ICD-10-CM

## 2020-09-30 DIAGNOSIS — D123 Benign neoplasm of transverse colon: Secondary | ICD-10-CM

## 2020-09-30 DIAGNOSIS — K635 Polyp of colon: Secondary | ICD-10-CM

## 2020-09-30 DIAGNOSIS — K640 First degree hemorrhoids: Secondary | ICD-10-CM

## 2020-09-30 MED ORDER — SODIUM CHLORIDE 0.9 % IV SOLN: 500.0000 mL | Freq: Once | INTRAVENOUS | Status: DC

## 2020-09-30 NOTE — Progress Notes (Signed)
Called to room to assist during endoscopic procedure.  Patient ID and intended procedure confirmed with present staff. Received instructions for my participation in the procedure from the performing physician.  

## 2020-09-30 NOTE — Progress Notes (Signed)
Pt's states no medical or surgical changes since previsit or office visit. 

## 2020-09-30 NOTE — Progress Notes (Signed)
To PACU, VSS. Report to [email or web address] 

## 2020-09-30 NOTE — Patient Instructions (Signed)
Discharge instructions given. Handouts on polyps and hemorrhoids. Resume previous medications. YOU HAD AN ENDOSCOPIC PROCEDURE TODAY AT THE [facility]:   Refer to the procedure report that was given to you for any specific questions about what was found during the examination.  If the procedure report does not answer your questions, please call your gastroenterologist to clarify.  If you requested that your care partner not be given the details of your procedure findings, then the procedure report has been included in a sealed envelope for you to review at your convenience later.  YOU SHOULD EXPECT: Some feelings of bloating in the abdomen. Passage of more gas than usual.  Walking can help get rid of the air that was put into your GI tract during the procedure and reduce the bloating. If you had a lower endoscopy (such as a colonoscopy or flexible sigmoidoscopy) you may notice spotting of blood in your stool or on the toilet paper. If you underwent a bowel prep for your procedure, you may not have a normal bowel movement for a few days.  Please Note:  You might notice some irritation and congestion in your nose or some drainage.  This is from the oxygen used during your procedure.  There is no need for concern and it should clear up in a day or so.  SYMPTOMS TO REPORT IMMEDIATELY:  Following lower endoscopy (colonoscopy or flexible sigmoidoscopy):  Excessive amounts of blood in the stool  Significant tenderness or worsening of abdominal pains  Swelling of the abdomen that is new, acute  Fever of 100F or higher   For urgent or emergent issues, a gastroenterologist can be reached at any hour by calling [phone]. Do not use MyChart messaging for urgent concerns.    DIET:  We do recommend a small meal at first, but then you may proceed to your regular diet.  Drink plenty of fluids but you should avoid alcoholic beverages for 24 hours.  ACTIVITY:  You should plan to take it  easy for the rest of today and you should NOT DRIVE or use heavy machinery until tomorrow (because of the sedation medicines used during the test).    FOLLOW UP: Our staff will call the number listed on your records 48-72 hours following your procedure to check on you and address any questions or concerns that you may have regarding the information given to you following your procedure. If we do not reach you, we will leave a message.  We will attempt to reach you two times.  During this call, we will ask if you have developed any symptoms of COVID 19. If you develop any symptoms (ie: fever, flu-like symptoms, shortness of breath, cough etc.) before then, please call [phone].  If you test positive for Covid 19 in the 2 weeks post procedure, please call and report this information to us.    If any biopsies were taken you will be contacted by phone or by letter within the next 1-3 weeks.  Please call us at [phone] if you have not heard about the biopsies in 3 weeks.    SIGNATURES/CONFIDENTIALITY: You and/or your care partner have signed paperwork which will be entered into your electronic medical record.  These signatures attest to the fact that that the information above on your After Visit Summary has been reviewed and is understood.  Full responsibility of the confidentiality of this discharge information lies with you and/or your care-partner.  

## 2020-09-30 NOTE — Op Note (Signed)
[facility] Patient Name: Kirsten Benson Procedure Date: 09/30/2020 9:13 AM MRN: [id number] Endoscopist: Meryl Dare , MD Age: 50 Referring MD:  Date of Birth: [birth year] Gender: Female Account #: [id number] Procedure:                Colonoscopy Indications:              Lower abdominal pain, Abnormal CT of the GI tract                            (colon), Clinically significant diarrhea of                            unexplained origin Medicines:                Monitored Anesthesia Care Procedure:                Pre-Anesthesia Assessment:                           - Prior to the procedure, a History and Physical                            was performed, and patient medications and                            allergies were reviewed. The patient's tolerance of                            previous anesthesia was also reviewed. The risks                            and benefits of the procedure and the sedation                            options and risks were discussed with the patient.                            All questions were answered, and informed consent                            was obtained. Prior Anticoagulants: The patient has                            taken no previous anticoagulant or antiplatelet                            agents. ASA Grade Assessment: II - A patient with                            mild systemic disease. After reviewing the risks                            and benefits, the patient was deemed in  satisfactory condition to undergo the procedure.                           After obtaining informed consent, the colonoscope                            was passed under direct vision. Throughout the                            procedure, the patient's blood pressure, pulse, and                            oxygen saturations were monitored continuously. The                            Olympus PFC-[id number] (#[id number]) Colonoscope was                             introduced through the anus and advanced to the the                            terminal ileum, with identification of the                            appendiceal orifice and IC valve. The terminal                            ileum, ileocecal valve, appendiceal orifice, and                            rectum were photographed. The quality of the bowel                            preparation was good. The colonoscopy was performed                            without difficulty. The patient tolerated the                            procedure well. Scope In: 9:14:59 AM Scope Out: 9:29:17 AM Scope Withdrawal Time: 0 hours 12 minutes 26 seconds  Total Procedure Duration: 0 hours 14 minutes 18 seconds  Findings:                 The perianal and digital rectal examinations were                            normal.                           The terminal ileum appeared normal.                           Two sessile polyps were found in the descending  colon and transverse colon. The polyps were 7 mm in                            size. These polyps were removed with a cold snare.                            Resection and retrieval were complete.                           Internal hemorrhoids were found during                            retroflexion. The hemorrhoids were moderate and                            Grade I (internal hemorrhoids that do not prolapse).                           The exam was otherwise without abnormality on                            direct and retroflexion views. Random biopsies                            taken throughout. Complications:            No immediate complications. Estimated blood loss:                            None. Estimated Blood Loss:     Estimated blood loss: none. Impression:               - The examined portion of the ileum was normal.                           - Two 7 mm polyps in the descending colon and in                             the transverse colon, removed with a cold snare.                            Resected and retrieved.                           - Internal hemorrhoids.                           - The examination was otherwise normal on direct                            and retroflexion views. Random biopsies taken. Recommendation:           - Repeat colonoscopy after studies are complete for                            surveillance based  on pathology results.                           - Patient has a contact number available for                            emergencies. The signs and symptoms of potential                            delayed complications were discussed with the                            patient. Return to normal activities tomorrow.                            Written discharge instructions were provided to the                            patient.                           - Resume previous diet.                           - Continue present medications.                           - Begin glycopyrrolate 2 mg po bid as prescribed.                           - Await pathology results.                           - GI office appt in 2 months. Ladene Artist, MD 09/30/2020 9:34:18 AM This report has been signed electronically.

## 2020-10-04 ENCOUNTER — Telehealth: Payer: Self-pay

## 2020-10-04 ENCOUNTER — Telehealth: Payer: Self-pay | Admitting: *Deleted

## 2020-10-04 NOTE — Telephone Encounter (Signed)
Attempted to reach patient for post-procedure f/u call. No answer. Left message for her to please not hesitate to call us if she has any questions/concerns regarding her care. 

## 2020-10-04 NOTE — Telephone Encounter (Signed)
Message left

## 2020-10-10 ENCOUNTER — Encounter: Payer: Self-pay | Admitting: Gastroenterology

## 2021-08-21 ENCOUNTER — Other Ambulatory Visit: Payer: Self-pay | Admitting: Physician Assistant

## 2021-08-21 DIAGNOSIS — Z1231 Encounter for screening mammogram for malignant neoplasm of breast: Secondary | ICD-10-CM

## 2021-08-31 ENCOUNTER — Ambulatory Visit
Admission: RE | Admit: 2021-08-31 | Discharge: 2021-08-31 | Disposition: A | Discharge status: Home / Self Care | Payer: BC Managed Care – PPO | Source: Ambulatory Visit | Attending: Physician Assistant | Admitting: Physician Assistant

## 2021-08-31 DIAGNOSIS — Z1231 Encounter for screening mammogram for malignant neoplasm of breast: Secondary | ICD-10-CM

## 2023-07-06 IMAGING — MG MM DIGITAL SCREENING BILAT W/ TOMO AND CAD
8 series · 9 of 24 positions shown · non-contrast
Comparison: Previous exam(s).

CLINICAL DATA: Screening.

EXAM:
DIGITAL SCREENING BILATERAL MAMMOGRAM WITH TOMOSYNTHESIS AND CAD
TECHNIQUE: Bilateral screening digital craniocaudal and mediolateral oblique
mammograms were obtained. Bilateral screening digital breast
tomosynthesis was performed. The images were evaluated with
computer-aided detection.

[L MLO synth-2D]
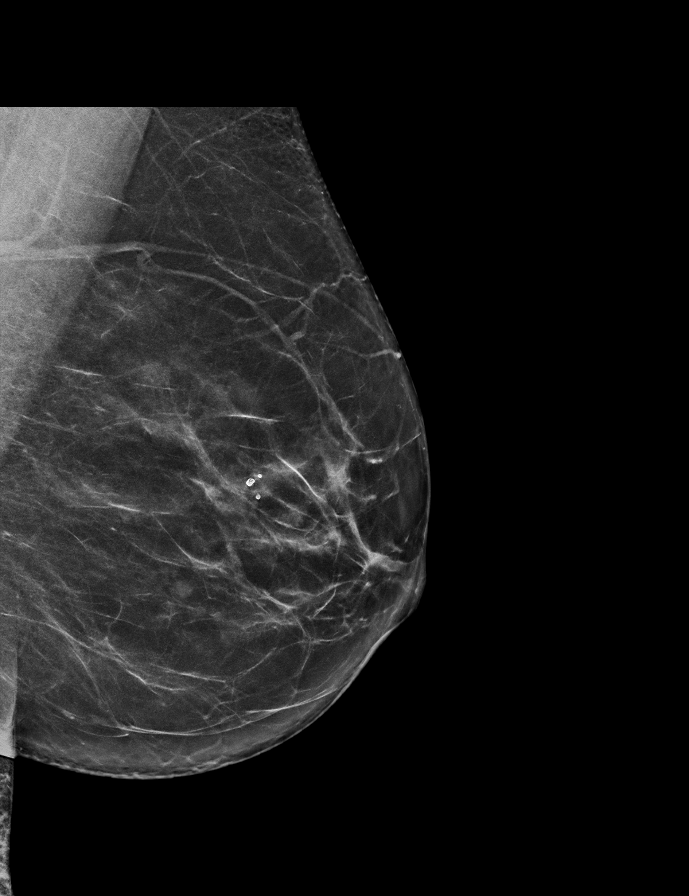

[L CC synth-2D]
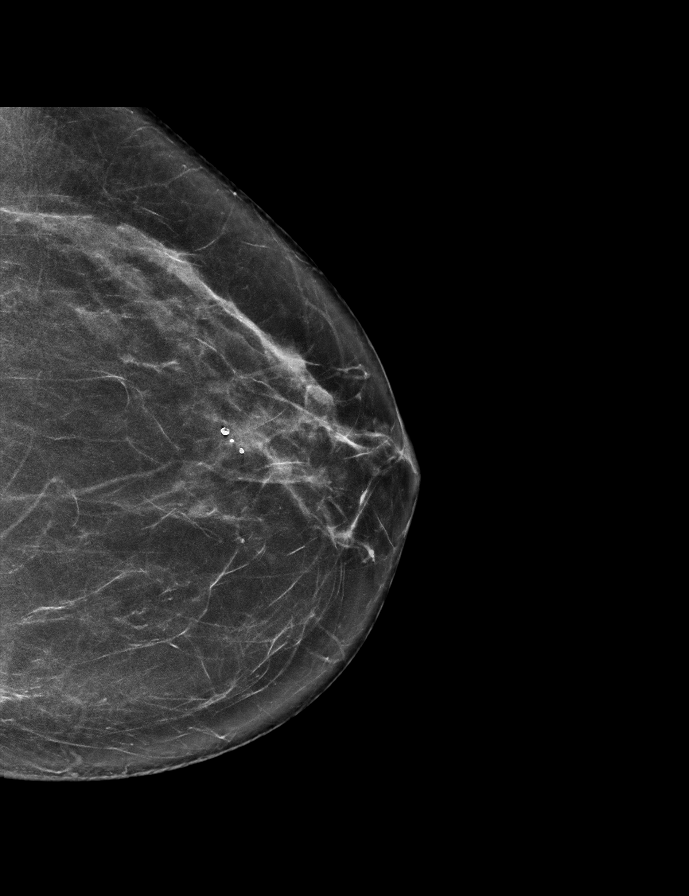

[R MLO synth-2D]
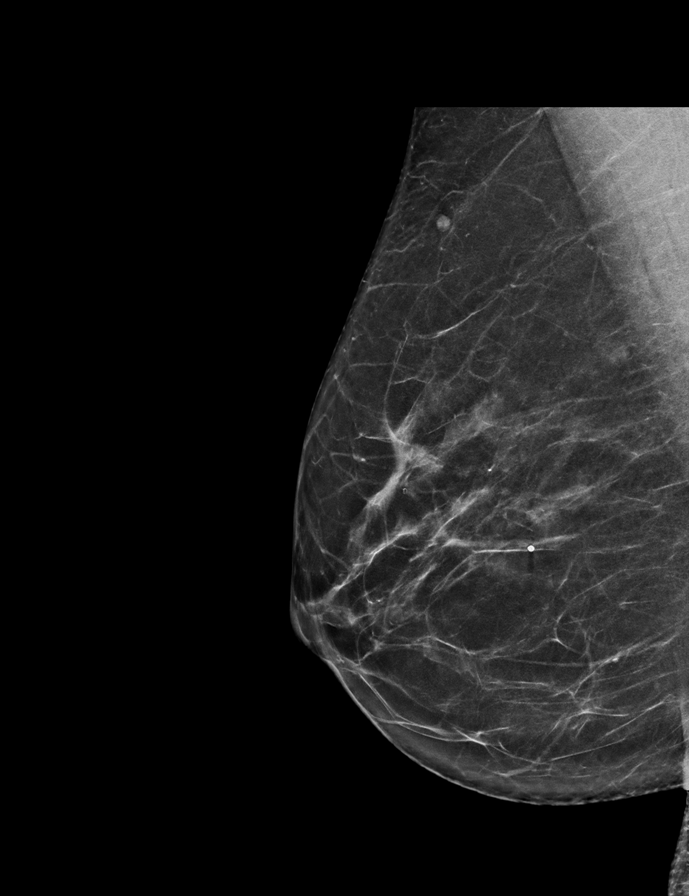

[R CC synth-2D]
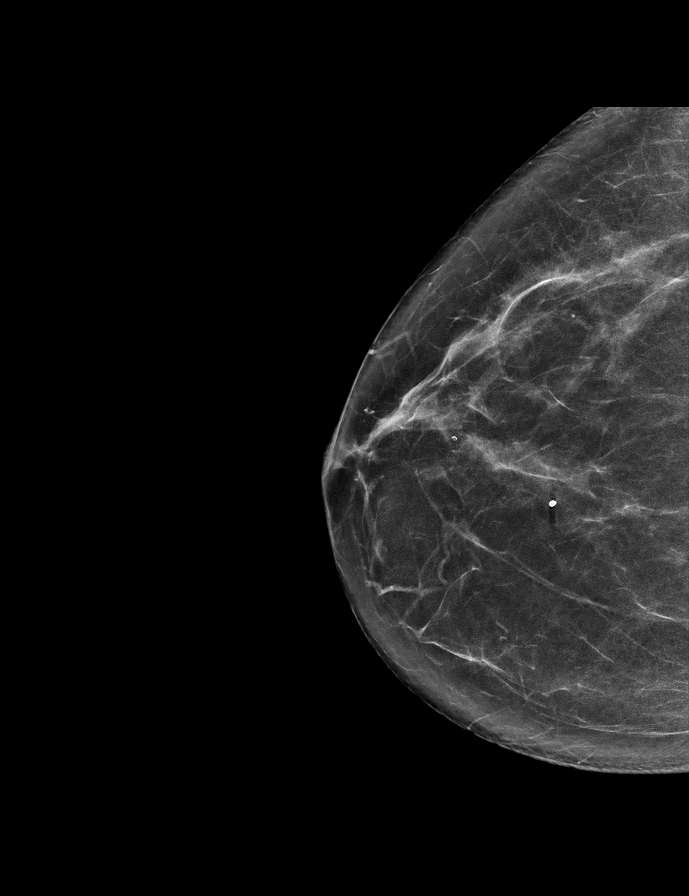

[R MLO tomo · 2 of 65 frames shown]
[frame 21/65]
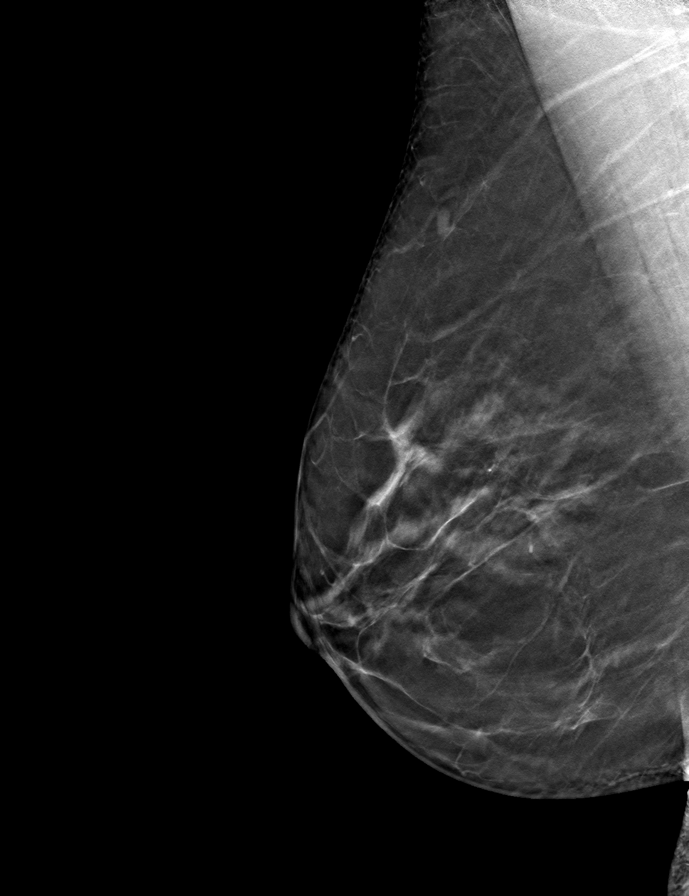
[frame 33/65]
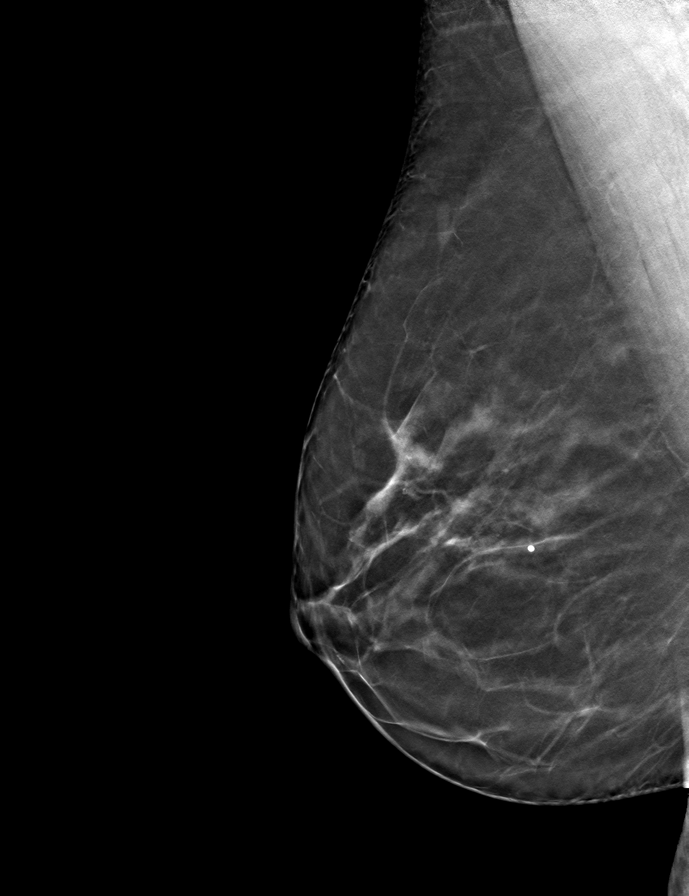

[R CC tomo · tomo slice 33/64.0]
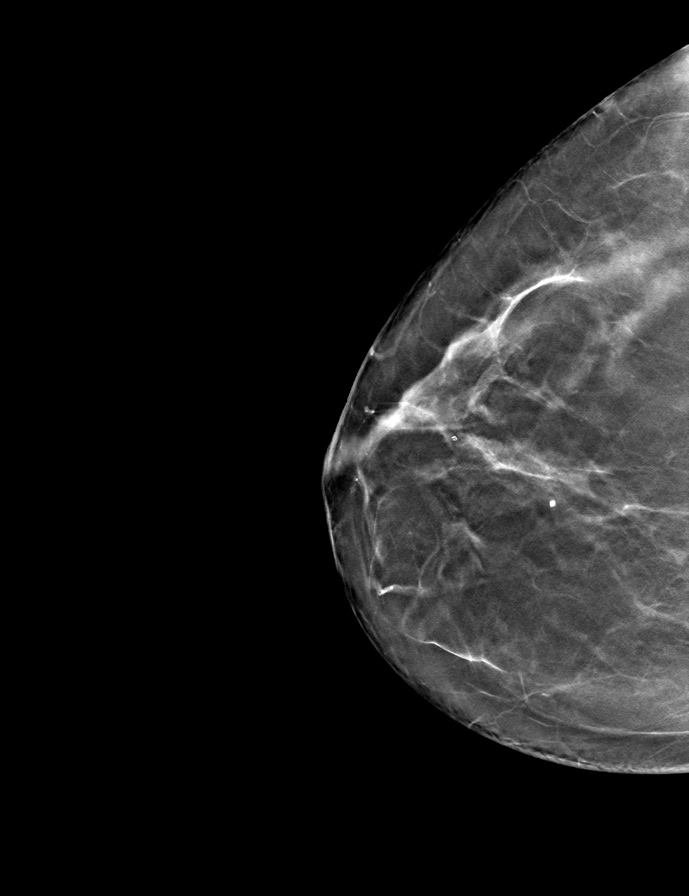

[L CC tomo · tomo slice 34/67.0]
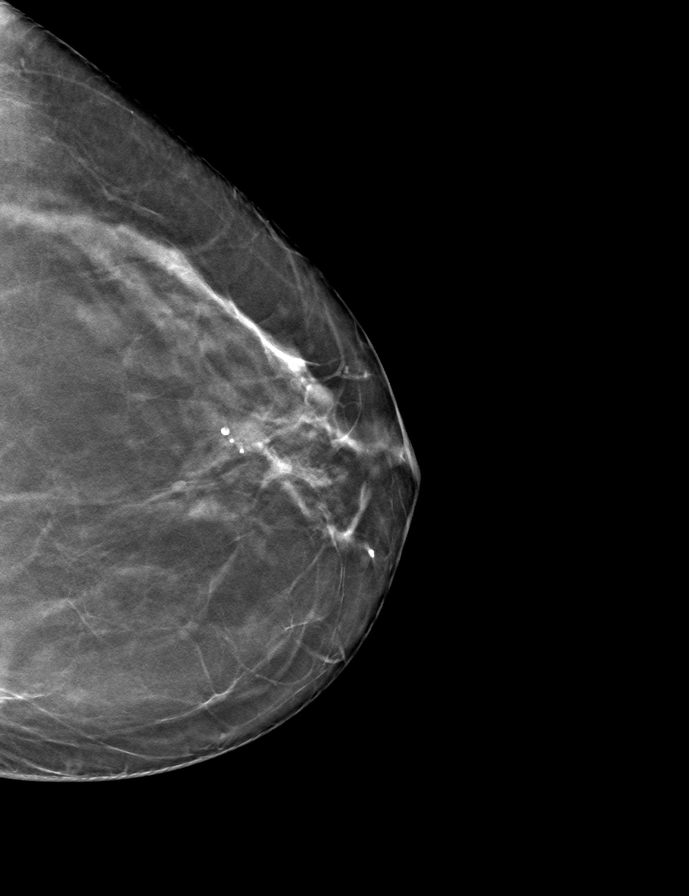

[L MLO tomo · tomo slice 35/68.0]
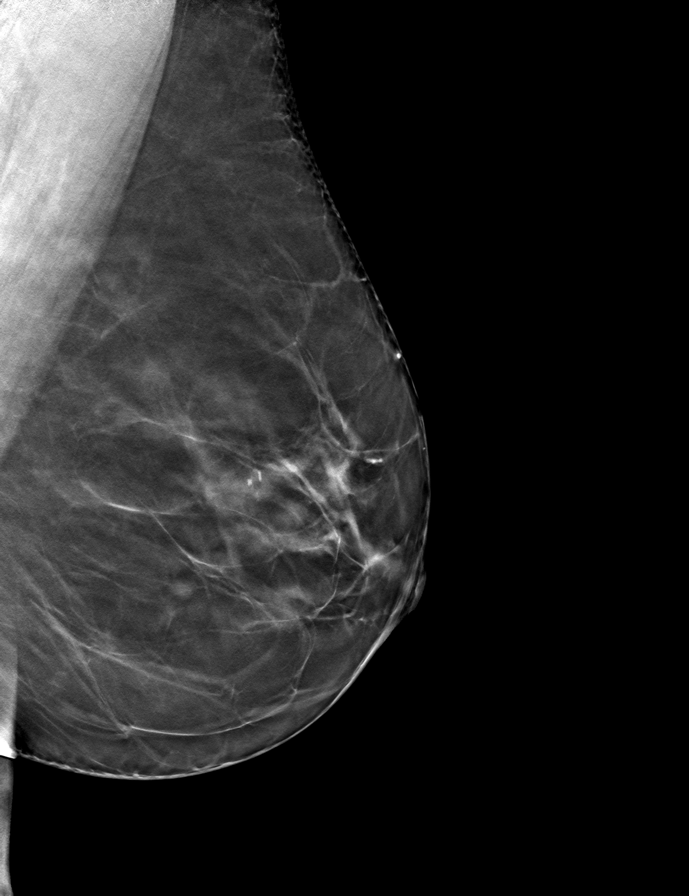

[9 of 24 positions shown; findings below may reference images not displayed]

ACR Breast Density Category b: There are scattered areas of
fibroglandular density.
FINDINGS: There are no findings suspicious for malignancy.
IMPRESSION: No mammographic evidence of malignancy. A result letter of this
screening mammogram will be mailed directly to the patient.

RECOMMENDATION:
Screening mammogram in one year. (Code:51-O-LD2)

BI-RADS CATEGORY  1: Negative.

## 2024-04-28 ENCOUNTER — Other Ambulatory Visit: Payer: Self-pay | Admitting: Nurse Practitioner

## 2024-04-28 DIAGNOSIS — Z1231 Encounter for screening mammogram for malignant neoplasm of breast: Secondary | ICD-10-CM
